# Patient Record
Sex: Male | Born: 1960 | Race: White | Hispanic: No | Marital: Married | Smoking: Former smoker
Health system: Southern US, Community
[De-identification: ages and names within clinical notes are randomized; demographics above are authoritative.]

## PROBLEM LIST (undated history)

## (undated) DIAGNOSIS — M1990 Unspecified osteoarthritis, unspecified site: Secondary | ICD-10-CM

## (undated) DIAGNOSIS — E789 Disorder of lipoprotein metabolism, unspecified: Secondary | ICD-10-CM

## (undated) HISTORY — DX: Unspecified osteoarthritis, unspecified site: M19.90

---

## 2010-04-23 ENCOUNTER — Emergency Department (HOSPITAL_COMMUNITY): Admission: EM | Admit: 2010-04-23 | Discharge: 2010-04-24 | Payer: Self-pay | Admitting: Emergency Medicine

## 2010-10-26 LAB — CBC
Hemoglobin: 14.9 g/dL (ref 13.0–17.0)
MCH: 30.7 pg (ref 26.0–34.0)
MCV: 85.6 fL (ref 78.0–100.0)
Platelets: 285 10*3/uL (ref 150–400)
RBC: 4.86 MIL/uL (ref 4.22–5.81)
WBC: 8.7 10*3/uL (ref 4.0–10.5)

## 2010-10-26 LAB — BASIC METABOLIC PANEL
CO2: 22 mEq/L (ref 19–32)
Chloride: 107 mEq/L (ref 96–112)
Creatinine, Ser: 1.14 mg/dL (ref 0.4–1.5)
GFR calc Af Amer: >60 mL/min (ref >60)
Potassium: 3.8 mEq/L (ref 3.5–5.1)
Sodium: 138 mEq/L (ref 135–145)

## 2010-10-26 LAB — PROTIME-INR
INR: 0.96 (ref 0.00–1.49)
Prothrombin Time: 13 seconds (ref 11.6–15.2)

## 2010-10-26 LAB — DIFFERENTIAL
Eosinophils Relative: 1 % (ref 0–5)
Lymphocytes Relative: 27 % (ref 12–46)
Lymphs Abs: 2.3 10*3/uL (ref 0.7–4.0)
Monocytes Relative: 7 % (ref 3–12)

## 2015-12-06 ENCOUNTER — Encounter: Payer: Self-pay | Admitting: *Deleted

## 2015-12-06 ENCOUNTER — Encounter (INDEPENDENT_AMBULATORY_CARE_PROVIDER_SITE_OTHER): Payer: Self-pay

## 2015-12-06 ENCOUNTER — Encounter: Payer: Self-pay | Admitting: Family Medicine

## 2015-12-06 ENCOUNTER — Ambulatory Visit (INDEPENDENT_AMBULATORY_CARE_PROVIDER_SITE_OTHER): Payer: PRIVATE HEALTH INSURANCE | Admitting: Family Medicine

## 2015-12-06 VITALS — BP 138/94 | HR 64 | Temp 97.5°F | Ht 67.0 in | Wt 160.8 lb

## 2015-12-06 DIAGNOSIS — Z0000 Encounter for general adult medical examination without abnormal findings: Secondary | ICD-10-CM

## 2015-12-06 DIAGNOSIS — R030 Elevated blood-pressure reading, without diagnosis of hypertension: Secondary | ICD-10-CM | POA: Insufficient documentation

## 2015-12-06 DIAGNOSIS — Z800 Family history of malignant neoplasm of digestive organs: Secondary | ICD-10-CM | POA: Insufficient documentation

## 2015-12-06 NOTE — Progress Notes (Signed)
   HPI  Patient presents today here to establish care with an annual physical.  Patient has not been to his previous provider in over 10 years. No history of any serious medical conditions except for decreased movement of his left thigh. He has no diplopia with this. It has been going on his entire life.  He is active at work, he has no formal exercise routine He watches what he eats pretty carefully, reducing fried and fatty foods and with portion control.  He denies any concerns today.  PMH: Smoking status noted His past medical, surgical, social, family history reviewed and updated in EMR ROS: Per HPI  Objective: BP 138/94 mmHg  Pulse 64  Temp(Src) 97.5 F (36.4 C) (Oral)  Ht '5\' 7"'$  (1.702 m)  Wt 160 lb 12.8 oz (72.938 kg)  BMI 25.18 kg/m2 Gen: NAD, alert, cooperative with exam HEENT: NCAT, left eye with no abduction CV: RRR, good S1/S2, no murmur Resp: CTABL, no wheezes, non-labored Abd: SNTND, BS present, no guarding or organomegaly Ext: No edema, warm Neuro: Alert and oriented, No gross deficits  Assessment and plan:  # Annual exam Normal exam except for ocular findings which are stable His brother had colon cancer at age of 46, sent him for colonoscopy Basic labs, previously elevated cholesterol and had loose stools with Lipitor, he's changed his diet pretty drastically since that time Return to clinic in 4-6 weeks to discuss high blood pressure, rechecked with manual cuff today and it was stable  # Elevated blood pressure without diagnosis of hypertension Blood pressure log    Orders Placed This Encounter  Procedures  . CMP14+EGFR  . CBC with Differential  . TSH  . PSA  . Lipid Panel  . Ambulatory referral to Gastroenterology    Referral Priority:  Routine    Referral Type:  Consultation    Referral Reason:  Specialty Services Required    Number of Visits Requested:  [place], MD [facility] 12/06/2015, 10:31  AM

## 2015-12-06 NOTE — Patient Instructions (Signed)
Great to meet you!  We will call with labs within 1 week  Keep a few blood pressures over the next 4-6 weeks  Come  Back in 4-6 weeks to discuss blood pressure

## 2015-12-07 LAB — CBC WITH DIFFERENTIAL/PLATELET
BASOS ABS: 0.1 10*3/uL (ref 0.0–0.2)
BASOS: 2 %
EOS (ABSOLUTE): 0.2 10*3/uL (ref 0.0–0.4)
Eos: 3 %
Hematocrit: 40.9 % (ref 37.5–51.0)
Hemoglobin: 13.5 g/dL (ref 12.6–17.7)
Immature Grans (Abs): 0 10*3/uL (ref 0.0–0.1)
Immature Granulocytes: 0 %
LYMPHS ABS: 2 10*3/uL (ref 0.7–3.1)
Lymphs: 29 %
MCH: 29.3 pg (ref 26.6–33.0)
MCHC: 33 g/dL (ref 31.5–35.7)
MCV: 89 fL (ref 79–97)
MONOS ABS: 0.5 10*3/uL (ref 0.1–0.9)
Monocytes: 7 %
NEUTROS ABS: 4.2 10*3/uL (ref 1.4–7.0)
Neutrophils: 59 %
PLATELETS: 270 10*3/uL (ref 150–379)
RBC: 4.61 x10E6/uL (ref 4.14–5.80)
RDW: 14 % (ref 12.3–15.4)
WBC: 7 10*3/uL (ref 3.4–10.8)

## 2015-12-07 LAB — CMP14+EGFR
A/G RATIO: 1.8 (ref 1.2–2.2)
ALT: 15 IU/L (ref 0–44)
AST: 23 IU/L (ref 0–40)
Albumin: 4.4 g/dL (ref 3.5–5.5)
Alkaline Phosphatase: 59 IU/L (ref 39–117)
BILIRUBIN TOTAL: 0.5 mg/dL (ref 0.0–1.2)
BUN/Creatinine Ratio: 13 (ref 9–20)
BUN: 13 mg/dL (ref 6–24)
CALCIUM: 9.5 mg/dL (ref 8.7–10.2)
CHLORIDE: 101 mmol/L (ref 96–106)
CO2: 24 mmol/L (ref 18–29)
Creatinine, Ser: 1.02 mg/dL (ref 0.76–1.27)
GFR, EST AFRICAN AMERICAN: 95 mL/min/{1.73_m2} (ref >59)
GFR, EST NON AFRICAN AMERICAN: 82 mL/min/{1.73_m2} (ref >59)
GLOBULIN, TOTAL: 2.5 g/dL (ref 1.5–4.5)
Glucose: 99 mg/dL (ref 65–99)
POTASSIUM: 4.9 mmol/L (ref 3.5–5.2)
SODIUM: 138 mmol/L (ref 134–144)
TOTAL PROTEIN: 6.9 g/dL (ref 6.0–8.5)

## 2015-12-07 LAB — TSH: TSH: 2.62 u[IU]/mL (ref 0.450–4.500)

## 2015-12-07 LAB — LIPID PANEL
CHOL/HDL RATIO: 3.2 ratio (ref 0.0–5.0)
Cholesterol, Total: 187 mg/dL (ref 100–199)
HDL: 58 mg/dL (ref >39)
LDL CALC: 90 mg/dL (ref 0–99)
TRIGLYCERIDES: 194 mg/dL — AB (ref 0–149)
VLDL Cholesterol Cal: 39 mg/dL (ref 5–40)

## 2015-12-07 LAB — PSA: Prostate Specific Ag, Serum: 1.5 ng/mL (ref 0.0–4.0)

## 2015-12-08 ENCOUNTER — Encounter (INDEPENDENT_AMBULATORY_CARE_PROVIDER_SITE_OTHER): Payer: Self-pay | Admitting: *Deleted

## 2016-01-04 ENCOUNTER — Ambulatory Visit (INDEPENDENT_AMBULATORY_CARE_PROVIDER_SITE_OTHER): Payer: PRIVATE HEALTH INSURANCE | Admitting: Family Medicine

## 2016-01-04 ENCOUNTER — Encounter: Payer: Self-pay | Admitting: Family Medicine

## 2016-01-04 VITALS — BP 131/84 | HR 72 | Temp 97.6°F | Ht 67.0 in | Wt 157.4 lb

## 2016-01-04 DIAGNOSIS — L989 Disorder of the skin and subcutaneous tissue, unspecified: Secondary | ICD-10-CM | POA: Diagnosis not present

## 2016-01-04 DIAGNOSIS — R030 Elevated blood-pressure reading, without diagnosis of hypertension: Secondary | ICD-10-CM | POA: Diagnosis not present

## 2016-01-04 NOTE — Patient Instructions (Signed)
Great to meet you!  Check your blood pressure a few times a month, 140/90 or less is my goal, great job making changes  Keep an eye on that skin lesion on your right wrist if it does not go away in 1-2 months or if it gets worse  Come back in 1 year unless you need us sooner.

## 2016-01-04 NOTE — Progress Notes (Signed)
   HPI  Patient presents today here for blood pressure follow-up.  Patient has been keeping a blood pressure log, his blood pressure has mostly been around 130/90 He had one reading greater than 90 diastolic and 1 reading that was 140 systolic. He has a blood pressure cuff at home. No chest pain, dyspnea, palpitations, leg edema.  He states that he's cut back on his salt intake and is eating much better. He would like to avoid medications at this time.  Skin lesion on right wrist Has been present about 4 weeks or so, not changing and not irritating.   PMH: Smoking status noted ROS: Per HPI  Objective: BP 131/84 mmHg  Pulse 72  Temp(Src) 97.6 F (36.4 C) (Oral)  Ht 5\' 7"  (1.702 m)  Wt 157 lb 6.4 oz (71.396 kg)  BMI 24.65 kg/m2 Gen: NAD, alert, cooperative with exam HEENT: NCAT CV: RRR, good S1/S2, no murmur Resp: CTABL, no wheezes, non-labored Ext: No edema, warm Neuro: Alert and oriented, No gross deficits  Skin Right dorsal wrist with 6 mm x 8 mm scaly lesion on an erythematous base, slightly verrucous in appearance  Left forearm with small white lesion consistent with actinic keratosis  Assessment and plan:  # Elevated blood pressure without diagnosis of hypertension Blood pressure is borderline to good Recommended continued aggressive diet and lifestyle changes Continue to monitor closely Follow-up in one year unless blood pressure rises above 140/90 consistently  # Skin lesion Right wrist is suspicious for wart versus squamous cell carcinoma, watchful waiting for 1-2 months, if not resolved or if worsening will come back for shave biopsy Left forearm lesion looks more like an actinic keratosis, however he does not have a history of any AK's, he will monitor this as well we discussed the risk of transformation into skin cancer   Murtis SinkSam Bradshaw, MD [facility] 01/04/2016, 4:45 PM

## 2016-07-10 ENCOUNTER — Other Ambulatory Visit: Payer: Self-pay | Admitting: Occupational Medicine

## 2016-07-10 ENCOUNTER — Ambulatory Visit: Payer: Self-pay

## 2016-07-10 DIAGNOSIS — M79642 Pain in left hand: Secondary | ICD-10-CM

## 2017-04-09 ENCOUNTER — Ambulatory Visit (INDEPENDENT_AMBULATORY_CARE_PROVIDER_SITE_OTHER): Payer: PRIVATE HEALTH INSURANCE | Admitting: Family Medicine

## 2017-04-09 ENCOUNTER — Encounter: Payer: Self-pay | Admitting: Family Medicine

## 2017-04-09 VITALS — BP 122/80 | HR 74 | Temp 98.7°F | Ht 67.0 in | Wt 159.2 lb

## 2017-04-09 DIAGNOSIS — L247 Irritant contact dermatitis due to plants, except food: Secondary | ICD-10-CM | POA: Diagnosis not present

## 2017-04-09 MED ORDER — TRIAMCINOLONE ACETONIDE 0.5 % EX OINT: 1.0000 "application " | TOPICAL_OINTMENT | Freq: Two times a day (BID) | CUTANEOUS | 0 refills | Status: DC

## 2017-04-09 MED ORDER — TRIAMCINOLONE ACETONIDE 40 MG/ML IJ SUSP
40.0000 mg | Freq: Once | INTRAMUSCULAR | Status: AC
  Administered 2017-04-09: 40 mg via INTRAMUSCULAR

## 2017-04-09 NOTE — Progress Notes (Signed)
   HPI  Patient presents today really to rash.  Patient explains he had symptoms for 4 days after being exposed to poison oak after weed eating. He states he has itchy bumps on his bilateral arms, legs, and back.  He denies fever, chills, sweats, or difficulty tolerating foods or fluids.  PMH: Smoking status noted ROS: Per HPI  Objective: BP 122/80   Pulse 74   Temp 98.7 F (37.1 C) (Oral)   Ht 5\' 7"  (1.702 m)   Wt 159 lb 3.2 oz (72.2 kg)   BMI 24.93 kg/m  Gen: NAD, alert, cooperative with exam HEENT: NCAT CV: RRR, good S1/S2, no murmur Resp: CTABL, no wheezes, non-labored Ext: No edema, warm Neuro: Alert and oriented, No gross deficits Skin:  Erythematous papules scattered across bilateral arms, low back, right lower extremity  Assessment and plan:  # Contact dermatitis, poison oak exposure Rash consistent with contact dermatitis Treat with IM Kenalog which should last in his system 3 weeks. Also Kenalog ointment given Discussed supportive care Return to clinic with any concerns  Meds ordered this encounter  Medications  . triamcinolone acetonide (KENALOG-40) injection 40 mg  . triamcinolone ointment (KENALOG) 0.5 %    Sig: Apply 1 application topically 2 (two) times daily.    Dispense:  30 g    Refill:  0    Murtis Sink, MD [place] 04/09/2017, 3:01 PM

## 2017-04-09 NOTE — Patient Instructions (Addendum)
Great to see you!  Come back with any concerns  Use kenalog ointment as needed up to twice daily   Contact Dermatitis Dermatitis is redness, soreness, and swelling (inflammation) of the skin. Contact dermatitis is a reaction to certain substances that touch the skin. You either touched something that irritated your skin, or you have allergies to something you touched. Follow these instructions at home: Skin Care  Moisturize your skin as needed.  Apply cool compresses to the affected areas.  Try taking a bath with: ? Epsom salts. Follow the instructions on the package. You can get these at a pharmacy or grocery store. ? Baking soda. Pour a small amount into the bath as told by your doctor. ? Colloidal oatmeal. Follow the instructions on the package. You can get this at a pharmacy or grocery store.  Try applying baking soda paste to your skin. Stir water into baking soda until it looks like paste.  Do not scratch your skin.  Bathe less often.  Bathe in lukewarm water. Avoid using hot water. Medicines  Take or apply over-the-counter and prescription medicines only as told by your doctor.  If you were prescribed an antibiotic medicine, take or apply your antibiotic as told by your doctor. Do not stop taking the antibiotic even if your condition starts to get better. General instructions  Keep all follow-up visits as told by your doctor. This is important.  Avoid the substance that caused your reaction. If you do not know what caused it, keep a journal to try to track what caused it. Write down: ? What you eat. ? What cosmetic products you use. ? What you drink. ? What you wear in the affected area. This includes jewelry.  If you were given a bandage (dressing), take care of it as told by your doctor. This includes when to change and remove it. Contact a doctor if:  You do not get better with treatment.  Your condition gets worse.  You have signs of infection such  as: ? Swelling. ? Tenderness. ? Redness. ? Soreness. ? Warmth.  You have a fever.  You have new symptoms. Get help right away if:  You have a very bad headache.  You have neck pain.  Your neck is stiff.  You throw up (vomit).  You feel very sleepy.  You see red streaks coming from the affected area.  Your bone or joint underneath the affected area becomes painful after the skin has healed.  The affected area turns darker.  You have trouble breathing. This information is not intended to replace advice given to you by your health care provider. Make sure you discuss any questions you have with your health care provider. Document Released: 05/27/2009 Document Revised: 01/05/2016 Document Reviewed: 12/15/2014 Elsevier Interactive Patient Education  2018 [employer].

## 2017-04-18 ENCOUNTER — Encounter (INDEPENDENT_AMBULATORY_CARE_PROVIDER_SITE_OTHER): Payer: Self-pay | Admitting: *Deleted

## 2017-04-18 ENCOUNTER — Encounter: Payer: Self-pay | Admitting: Family Medicine

## 2017-04-18 ENCOUNTER — Ambulatory Visit (INDEPENDENT_AMBULATORY_CARE_PROVIDER_SITE_OTHER): Payer: PRIVATE HEALTH INSURANCE | Admitting: Family Medicine

## 2017-04-18 VITALS — BP 136/82 | HR 59 | Temp 97.8°F | Ht 67.0 in | Wt 154.8 lb

## 2017-04-18 DIAGNOSIS — Z0000 Encounter for general adult medical examination without abnormal findings: Secondary | ICD-10-CM | POA: Diagnosis not present

## 2017-04-18 DIAGNOSIS — Z800 Family history of malignant neoplasm of digestive organs: Secondary | ICD-10-CM
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
## 2017-04-18 NOTE — Patient Instructions (Signed)
Great to see you! We will work on a referral to GI for a colonoscopy   Health Maintenance, Male A healthy lifestyle and preventive care is important for your health and wellness. Ask your health care provider about what schedule of regular examinations is right for you. What should I know about weight and diet? Eat a Healthy Diet  Eat plenty of vegetables, fruits, whole grains, low-fat dairy products, and lean protein.  Do not eat a lot of foods high in solid fats, added sugars, or salt.  Maintain a Healthy Weight Regular exercise can help you achieve or maintain a healthy weight. You should:  Do at least 150 minutes of exercise each week. The exercise should increase your heart rate and make you sweat (moderate-intensity exercise).  Do strength-training exercises at least twice a week.  Watch Your Levels of Cholesterol and Blood Lipids  Have your blood tested for lipids and cholesterol every 5 years starting at 56 years of age. If you are at high risk for heart disease, you should start having your blood tested when you are 56 years old. You may need to have your cholesterol levels checked more often if: ? Your lipid or cholesterol levels are high. ? You are older than 56 years of age. ? You are at high risk for heart disease.  What should I know about cancer screening? Many types of cancers can be detected early and may often be prevented. Lung Cancer  You should be screened every year for lung cancer if: ? You are a current smoker who has smoked for at least 30 years. ? You are a former smoker who has quit within the past 15 years.  Talk to your health care provider about your screening options, when you should start screening, and how often you should be screened.  Colorectal Cancer  Routine colorectal cancer screening usually begins at 56 years of age and should be repeated every 5-10 years until you are 56 years old. You may need to be screened more often if early forms  of precancerous polyps or small growths are found. Your health care provider may recommend screening at an earlier age if you have risk factors for colon cancer.  Your health care provider may recommend using home test kits to check for hidden blood in the stool.  A small camera at the end of a tube can be used to examine your colon (sigmoidoscopy or colonoscopy). This checks for the earliest forms of colorectal cancer.  Prostate and Testicular Cancer  Depending on your age and overall health, your health care provider may do certain tests to screen for prostate and testicular cancer.  Talk to your health care provider about any symptoms or concerns you have about testicular or prostate cancer.  Skin Cancer  Check your skin from head to toe regularly.  Tell your health care provider about any new moles or changes in moles, especially if: ? There is a change in a mole's size, shape, or color. ? You have a mole that is larger than a pencil eraser.  Always use sunscreen. Apply sunscreen liberally and repeat throughout the day.  Protect yourself by wearing long sleeves, pants, a wide-brimmed hat, and sunglasses when outside.  What should I know about heart disease, diabetes, and high blood pressure?  If you are 2218-56 years of age, have your blood pressure checked every 3-5 years. If you are 56 years of age or older, have your blood pressure checked every year. You should  have your blood pressure measured twice-once when you are at a hospital or clinic, and once when you are not at a hospital or clinic. Record the average of the two measurements. To check your blood pressure when you are not at a hospital or clinic, you can use: ? An automated blood pressure machine at a pharmacy. ? A home blood pressure monitor.  Talk to your health care provider about your target blood pressure.  If you are between 35-35 years old, ask your health care provider if you should take aspirin to prevent heart  disease.  Have regular diabetes screenings by checking your fasting blood sugar level. ? If you are at a normal weight and have a low risk for diabetes, have this test once every three years after the age of 72. ? If you are overweight and have a high risk for diabetes, consider being tested at a younger age or more often.  A one-time screening for abdominal aortic aneurysm (AAA) by ultrasound is recommended for men aged 20-75 years who are current or former smokers. What should I know about preventing infection? Hepatitis B If you have a higher risk for hepatitis B, you should be screened for this virus. Talk with your health care provider to find out if you are at risk for hepatitis B infection. Hepatitis C Blood testing is recommended for:  Everyone born from [birth year] through 1965.  Anyone with known risk factors for hepatitis C.  Sexually Transmitted Diseases (STDs)  You should be screened each year for STDs including gonorrhea and chlamydia if: ? You are sexually active and are younger than 56 years of age. ? You are older than 56 years of age and your health care provider tells you that you are at risk for this type of infection. ? Your sexual activity has changed since you were last screened and you are at an increased risk for chlamydia or gonorrhea. Ask your health care provider if you are at risk.  Talk with your health care provider about whether you are at high risk of being infected with HIV. Your health care provider may recommend a prescription medicine to help prevent HIV infection.  What else can I do?  Schedule regular health, dental, and eye exams.  Stay current with your vaccines (immunizations).  Do not use any tobacco products, such as cigarettes, chewing tobacco, and e-cigarettes. If you need help quitting, ask your health care provider.  Limit alcohol intake to no more than 2 drinks per day. One drink equals 12 ounces of beer, 5 ounces of wine, or 1 ounces of  hard liquor.  Do not use street drugs.  Do not share needles.  Ask your health care provider for help if you need support or information about quitting drugs.  Tell your health care provider if you often feel depressed.  Tell your health care provider if you have ever been abused or do not feel safe at home. This information is not intended to replace advice given to you by your health care provider. Make sure you discuss any questions you have with your health care provider. Document Released: 01/26/2008 Document Revised: 03/28/2016 Document Reviewed: 05/03/2015 Elsevier Interactive Patient Education  [employer].

## 2017-04-19 ENCOUNTER — Other Ambulatory Visit: Payer: Self-pay

## 2017-04-19 DIAGNOSIS — E876 Hypokalemia: Secondary | ICD-10-CM

## 2017-04-19 LAB — CBC WITH DIFFERENTIAL/PLATELET
BASOS: 0 %
Basophils Absolute: 0 10*3/uL (ref 0.0–0.2)
EOS (ABSOLUTE): 0 10*3/uL (ref 0.0–0.4)
EOS: 1 %
HEMATOCRIT: 42 % (ref 37.5–51.0)
HEMOGLOBIN: 13.7 g/dL (ref 13.0–17.7)
IMMATURE GRANULOCYTES: 0 %
Immature Grans (Abs): 0 10*3/uL (ref 0.0–0.1)
LYMPHS ABS: 1.8 10*3/uL (ref 0.7–3.1)
Lymphs: 26 %
MCH: 29.7 pg (ref 26.6–33.0)
MCHC: 32.6 g/dL (ref 31.5–35.7)
MCV: 91 fL (ref 79–97)
MONOCYTES: 10 %
MONOS ABS: 0.7 10*3/uL (ref 0.1–0.9)
Neutrophils Absolute: 4.4 10*3/uL (ref 1.4–7.0)
Neutrophils: 63 %
Platelets: 307 10*3/uL (ref 150–379)
RBC: 4.62 x10E6/uL (ref 4.14–5.80)
RDW: 13.7 % (ref 12.3–15.4)
WBC: 7 10*3/uL (ref 3.4–10.8)

## 2017-04-19 LAB — PSA: Prostate Specific Ag, Serum: 1.1 ng/mL (ref 0.0–4.0)

## 2017-04-19 LAB — CMP14+EGFR
ALBUMIN: 5 g/dL (ref 3.5–5.5)
ALT: 21 IU/L (ref 0–44)
AST: 20 IU/L (ref 0–40)
Albumin/Globulin Ratio: 2.1 (ref 1.2–2.2)
Alkaline Phosphatase: 53 IU/L (ref 39–117)
BUN / CREAT RATIO: 16 (ref 9–20)
BUN: 20 mg/dL (ref 6–24)
Bilirubin Total: 0.8 mg/dL (ref 0.0–1.2)
CALCIUM: 10.3 mg/dL — AB (ref 8.7–10.2)
CO2: 23 mmol/L (ref 20–29)
CREATININE: 1.26 mg/dL (ref 0.76–1.27)
Chloride: 101 mmol/L (ref 96–106)
GFR, EST AFRICAN AMERICAN: 73 mL/min/{1.73_m2} (ref >59)
GFR, EST NON AFRICAN AMERICAN: 63 mL/min/{1.73_m2} (ref >59)
GLOBULIN, TOTAL: 2.4 g/dL (ref 1.5–4.5)
GLUCOSE: 85 mg/dL (ref 65–99)
Potassium: 5.3 mmol/L — ABNORMAL HIGH (ref 3.5–5.2)
SODIUM: 137 mmol/L (ref 134–144)
TOTAL PROTEIN: 7.4 g/dL (ref 6.0–8.5)

## 2017-04-19 LAB — LIPID PANEL
CHOL/HDL RATIO: 3.9 ratio (ref 0.0–5.0)
Cholesterol, Total: 197 mg/dL (ref 100–199)
HDL: 51 mg/dL (ref >39)
LDL CALC: 130 mg/dL — AB (ref 0–99)
TRIGLYCERIDES: 80 mg/dL (ref 0–149)
VLDL Cholesterol Cal: 16 mg/dL (ref 5–40)

## 2017-04-19 LAB — TSH: TSH: 1.58 u[IU]/mL (ref 0.450–4.500)

## 2017-04-22 ENCOUNTER — Other Ambulatory Visit: Payer: PRIVATE HEALTH INSURANCE

## 2017-04-22 DIAGNOSIS — E876 Hypokalemia: Secondary | ICD-10-CM

## 2017-04-22 LAB — BMP8+EGFR
BUN / CREAT RATIO: 13 (ref 9–20)
BUN: 15 mg/dL (ref 6–24)
CO2: 25 mmol/L (ref 20–29)
CREATININE: 1.13 mg/dL (ref 0.76–1.27)
Calcium: 9.7 mg/dL (ref 8.7–10.2)
Chloride: 100 mmol/L (ref 96–106)
GFR calc non Af Amer: 72 mL/min/{1.73_m2} (ref >59)
GFR, EST AFRICAN AMERICAN: 84 mL/min/{1.73_m2} (ref >59)
Glucose: 84 mg/dL (ref 65–99)
Potassium: 4.8 mmol/L (ref 3.5–5.2)
Sodium: 138 mmol/L (ref 134–144)

## 2017-05-01 ENCOUNTER — Encounter: Payer: Self-pay | Admitting: Family Medicine

## 2017-05-01 ENCOUNTER — Ambulatory Visit (INDEPENDENT_AMBULATORY_CARE_PROVIDER_SITE_OTHER): Payer: PRIVATE HEALTH INSURANCE | Admitting: Family Medicine

## 2017-05-01 VITALS — BP 124/83 | HR 66 | Temp 97.8°F | Ht 67.0 in | Wt 157.0 lb

## 2017-05-01 DIAGNOSIS — L309 Dermatitis, unspecified: Secondary | ICD-10-CM

## 2017-05-01 MED ORDER — BETAMETHASONE SOD PHOS & ACET 6 (3-3) MG/ML IJ SUSP
6.0000 mg | Freq: Once | INTRAMUSCULAR | Status: AC
  Administered 2017-05-01: 6 mg via INTRAMUSCULAR

## 2017-05-01 MED ORDER — TRIAMCINOLONE ACETONIDE 0.5 % EX CREA: 1.0000 "application " | TOPICAL_CREAM | Freq: Three times a day (TID) | CUTANEOUS | 0 refills | Status: DC

## 2017-05-01 NOTE — Progress Notes (Signed)
Chief Complaint  Patient presents with  . Rash    pt here today c/o itchy rash for the past few days on his arms, neck, shoulders and upper back.    HPI  Patient presents today for Onset 3-4 days ago of rash similar to last month when he had some poison oak poison ivy. That was treated with a steroid shot per Dr. Ermalinda Memos. He also put some cream on it. It cleared up nicely. This seems to be about the same thing. He doesn't know where it came from. He thinks he may not changed the sheets on the bed afterwards or he might of gotten off of wearing the same clothing. However he is aware that washing in warm soapy water should remove the resin from clothing etc.Today he has pruritic eruption on both upper arms on the ventral surface. This extends over the right shoulder. Also to the posterior neck. Some noted also on the right chest.  PMH: Smoking status noted ROS: Per HPI  Objective: BP 124/83   Pulse 66   Temp 97.8 F (36.6 C) (Oral)   Ht  (1.702 m)   Wt 157 lb (71.2 kg)   BMI 24.59 kg/m  Gen: NAD, alert, cooperative with exam HEENT: NCAT, EOMI, PERRL CV: RRR, good S1/S2, no murmur Resp: CTABL, no wheezes, non-labored Skin: Maculopapular erythema with few scattered vesicles noted on the ball of the right shoulder extending across the posterior trapezius to the posterior neck. Skin vesicles with mild erythema noted on the ventral surface of both forearms and upper arms Ext: No edema, warm Neuro: Alert and oriented, No gross deficits  Assessment and plan:  1. Dermatitis     Meds ordered this encounter  Medications  . betamethasone acetate-betamethasone sodium phosphate (CELESTONE) injection 6 mg  . triamcinolone cream (KENALOG) 0.5 %    Sig: Apply 1 application topically 3 (three) times daily.    Dispense:  30 g    Refill:  0    No orders of the defined types were placed in this encounter.   Follow up as needed.  Mechele Claude, MD

## 2017-05-13 ENCOUNTER — Encounter (HOSPITAL_COMMUNITY): Payer: Self-pay | Admitting: Emergency Medicine

## 2017-05-13 ENCOUNTER — Emergency Department (HOSPITAL_COMMUNITY)
Admission: EM | Admit: 2017-05-13 | Discharge: 2017-05-13 | Disposition: A | Discharge status: Home / Self Care | Payer: PRIVATE HEALTH INSURANCE | Attending: Emergency Medicine | Admitting: Emergency Medicine

## 2017-05-13 DIAGNOSIS — R21 Rash and other nonspecific skin eruption: Secondary | ICD-10-CM | POA: Insufficient documentation

## 2017-05-13 DIAGNOSIS — Z87891 Personal history of nicotine dependence: Secondary | ICD-10-CM | POA: Insufficient documentation

## 2017-05-13 MED ORDER — HYDROXYZINE PAMOATE 25 MG PO CAPS: 25.0000 mg | ORAL_CAPSULE | Freq: Four times a day (QID) | ORAL | 0 refills | Status: DC | PRN

## 2017-05-13 NOTE — ED Triage Notes (Signed)
Pt states he was recently treated with prednisone for poison ivy. He states the rash got better after treatment last week, but got worse again today.

## 2017-05-13 NOTE — ED Provider Notes (Signed)
[facility] DEPT Provider Note   CSN: [id number] Arrival date & time: 05/13/17  2017     History   Chief Complaint Chief Complaint  Patient presents with  . Rash    HPI Rayen Dafoe is a 56 y.o. male.  Patient is a 56 year old male who presents to the emergency department with a complaint of a rash.  The patient states that he woke up today to find a rash around his neck. He says he thinks he panicked because he felt flush and started noticing that the rash seemed to be moving over several areas of his body. He states that after he calmed down the itching was better the flushing was better but he still notices rash present. It is of note that the patient has had 2 rounds of steroids for what was initially believed to be poison ivy. He states that when he is taking the steroids to itching and burning seemed to improve, the rash improves, and when he is off of the steroids that the rash returns. He presents at this time for evaluation of the rash that he found when he woke up today.  The patient states that he has not changed his food or drink. He is not change medications, he is not been out of the country. He denies new clothing, new soaps, new dryer sheets, and new detergents. He does not recall being bit by anything. No new animals in the house. He states that his knee lives with his wife, and his wife does not have the same rash.   The history is provided by the patient.  Rash      Past Medical History:  Diagnosis Date  . Arthritis     Patient Active Problem List   Diagnosis Date Noted  . Skin lesion 01/04/2016  . Family history of colon cancer 12/06/2015  . Elevated blood pressure reading without diagnosis of hypertension 12/06/2015    History reviewed. No pertinent surgical history.     Home Medications    Prior to Admission medications   Medication Sig Start Date End Date Taking? Authorizing Provider  triamcinolone cream (KENALOG) 0.5 % Apply 1  application topically 3 (three) times daily. 05/01/17   Mechele Claude, MD    Family History Family History  Problem Relation Age of Onset  . Hypertension Mother   . Stroke Mother   . Cancer Father        esophgeal cancer  . Cancer Daughter        colon cancer    Social History Social History  Substance Use Topics  . Smoking status: Former Games developer  . Smokeless tobacco: Never Used  . Alcohol use 3.6 oz/week    6 Cans of beer per week     Allergies   Patient has no known allergies.   Review of Systems Review of Systems  Constitutional: Negative for activity change.       All ROS Neg except as noted in HPI  HENT: Negative for nosebleeds.   Eyes: Negative for photophobia and discharge.  Respiratory: Negative for cough, shortness of breath and wheezing.   Cardiovascular: Negative for chest pain and palpitations.  Gastrointestinal: Negative for abdominal pain and blood in stool.  Genitourinary: Negative for dysuria, frequency and hematuria.  Musculoskeletal: Negative for arthralgias, back pain and neck pain.  Skin: Positive for rash.  Neurological: Negative for dizziness, seizures and speech difficulty.  Psychiatric/Behavioral: Negative for confusion and hallucinations.     Physical Exam Updated Vital Signs BP Marland Kitchen)  158/96   Pulse 78   Temp 98.1 F (36.7 C)   Resp 18   Ht  (1.702 m)   Wt 72.6 kg (160 lb)   SpO2 99%   BMI 25.06 kg/m   Physical Exam  Constitutional: He is oriented to person, place, and time. He appears well-developed and well-nourished.  Non-toxic appearance.  HENT:  Head: Normocephalic.  Right Ear: Tympanic membrane and external ear normal.  Left Ear: Tympanic membrane and external ear normal.  Eyes: Pupils are equal, round, and reactive to light. EOM and lids are normal.  Neck: Normal range of motion. Neck supple. Carotid bruit is not present.  Cardiovascular: Normal rate, regular rhythm, normal heart sounds, intact distal pulses and  normal pulses.   Pulmonary/Chest: Breath sounds normal. No respiratory distress.  Abdominal: Soft. Bowel sounds are normal. There is no tenderness. There is no guarding.  Musculoskeletal: Normal range of motion.  Lymphadenopathy:       Head (right side): No submandibular adenopathy present.       Head (left side): No submandibular adenopathy present.    He has no cervical adenopathy.  Neurological: He is alert and oriented to person, place, and time. He has normal strength. No cranial nerve deficit or sensory deficit.  Skin: Skin is warm and dry.  There is a red fine papular rash noted on the abdomen, back, neck, and a few on the upper extremities. No red streaks appreciated. No hot areas appreciated. No drainage or weeping appreciated.  Psychiatric: He has a normal mood and affect. His speech is normal.  Nursing note and vitals reviewed.    ED Treatments / Results  Labs (all labs ordered are listed, but only abnormal results are displayed) Labs Reviewed - No data to display  EKG  EKG Interpretation None       Radiology No results found.  Procedures Procedures (including critical care time)  Medications Ordered in ED Medications - No data to display   Initial Impression / Assessment and Plan / ED Course  I have reviewed the triage vital signs and the nursing notes.  Pertinent labs & imaging results that were available during my care of the patient were reviewed by me and considered in my medical decision making (see chart for details).      Final Clinical Impressions(s) / ED Diagnoses MDM Blood pressure is elevated, otherwise vital signs are within normal limits. Pulse oximetry is 99% on room air. Within normal limits by my interpretation. The patient has a red papular rash on the abdomen, back, neck, and upper extremities.  Patient denies any recent changes. In particular there's been no change in medicines or foods. There's been no change in clothing or dryer  sheets or detergents. The patient has not been out of the country recently.  I've opted not to treat the patient with steroids at this time. I think he would be best served by a dermatology consultation. I did offer the patient medication for comfort including Vistaril. The patient states that he would like to go ahead and go to work tonight so he will receive a prescription for the Vistaril to use. He will be asked to use Allegra during the day and Vistaril at night. Patient is referred to dermatology. The patient is asked to return to the emergency department immediately if any emergent changes, problems, or concerns. Patient is in agreement with this plan.    Final diagnoses:  Rash    New Prescriptions New Prescriptions  HYDROXYZINE (VISTARIL) 25 MG CAPSULE    Take 1 capsule (25 mg total) by mouth every 6 (six) hours as needed for itching.     Ivery Quale, PA-C 05/13/17 1610    Gwyneth Sprout, MD 05/15/17 2100

## 2017-05-13 NOTE — Discharge Instructions (Signed)
Your blood pressure is elevated, otherwise your vital signs within normal limits. Your oxygen level is 99% on room air. Within normal limits. Please use Allegra each morning for itching and burning, please use Vistaril in the evenings and at bedtime. You may also use Vistaril every 6 hours if needed for severe itching or burning.This medication may cause drowsiness. Please do not drink, drive, or participate in activity that requires concentration while taking this medication. Please see list register or member of her team for dermatology evaluation.

## 2017-05-15 ENCOUNTER — Ambulatory Visit: Payer: PRIVATE HEALTH INSURANCE | Admitting: Family Medicine

## 2017-07-10 ENCOUNTER — Other Ambulatory Visit (INDEPENDENT_AMBULATORY_CARE_PROVIDER_SITE_OTHER): Payer: Self-pay | Admitting: *Deleted

## 2017-07-10 DIAGNOSIS — Z800 Family history of malignant neoplasm of digestive organs: Secondary | ICD-10-CM | POA: Insufficient documentation

## 2017-07-10 DIAGNOSIS — Z1211 Encounter for screening for malignant neoplasm of colon: Secondary | ICD-10-CM | POA: Insufficient documentation

## 2017-09-20 ENCOUNTER — Telehealth (INDEPENDENT_AMBULATORY_CARE_PROVIDER_SITE_OTHER): Payer: Self-pay | Admitting: *Deleted

## 2017-09-20 ENCOUNTER — Encounter (INDEPENDENT_AMBULATORY_CARE_PROVIDER_SITE_OTHER): Payer: Self-pay | Admitting: *Deleted

## 2017-09-20 NOTE — Telephone Encounter (Signed)
Patient needs trilyte 

## 2017-09-23 MED ORDER — PEG 3350-KCL-NA BICARB-NACL 420 G PO SOLR: 4000.0000 mL | Freq: Once | ORAL | 0 refills | Status: AC

## 2017-10-03 ENCOUNTER — Telehealth (INDEPENDENT_AMBULATORY_CARE_PROVIDER_SITE_OTHER): Payer: Self-pay | Admitting: *Deleted

## 2017-10-03 NOTE — Telephone Encounter (Signed)
Referring MD/PCP: bradshaw   Procedure: tcs  Reason/Indication:  Screening, fam hx colon ca  Has patient had this procedure before?  Yes, brother  If so, when, by whom and where?    Is there a family history of colon cancer?  no  Who?  What age when diagnosed?    Is patient diabetic?   no      Does patient have prosthetic heart valve or mechanical valve?  no  Do you have a pacemaker?  no  Has patient ever had endocarditis? no  Has patient had joint replacement within last 12 months?  no  Is patient constipated or do they take laxatives? some  Does patient have a history of alcohol/drug use?  no  Is patient on blood thinner such as Coumadin, Plavix and/or Aspirin? no  Medications: none  Allergies: nkda  Medication Adjustment per Dr Keane Policeehman/Terri Setzer, NP:   Procedure date & time: 10/30/17 at 930

## 2017-10-07 NOTE — Telephone Encounter (Signed)
agree

## 2017-10-30 ENCOUNTER — Ambulatory Visit (HOSPITAL_COMMUNITY)
Admission: RE | Admit: 2017-10-30 | Discharge: 2017-10-30 | Disposition: A | Discharge status: Home / Self Care | Payer: PRIVATE HEALTH INSURANCE | Source: Ambulatory Visit | Attending: Internal Medicine | Admitting: Internal Medicine

## 2017-10-30 ENCOUNTER — Encounter (HOSPITAL_COMMUNITY)
Admission: RE | Disposition: A | Discharge status: Home / Self Care | Payer: Self-pay | Source: Ambulatory Visit | Attending: Internal Medicine

## 2017-10-30 ENCOUNTER — Encounter (HOSPITAL_COMMUNITY): Payer: Self-pay

## 2017-10-30 ENCOUNTER — Other Ambulatory Visit: Payer: Self-pay

## 2017-10-30 DIAGNOSIS — Z79899 Other long term (current) drug therapy: Secondary | ICD-10-CM | POA: Insufficient documentation

## 2017-10-30 DIAGNOSIS — Z800 Family history of malignant neoplasm of digestive organs: Secondary | ICD-10-CM | POA: Insufficient documentation

## 2017-10-30 DIAGNOSIS — K648 Other hemorrhoids: Secondary | ICD-10-CM | POA: Diagnosis not present

## 2017-10-30 DIAGNOSIS — M1990 Unspecified osteoarthritis, unspecified site: Secondary | ICD-10-CM | POA: Diagnosis not present

## 2017-10-30 DIAGNOSIS — Z1211 Encounter for screening for malignant neoplasm of colon: Secondary | ICD-10-CM | POA: Insufficient documentation

## 2017-10-30 DIAGNOSIS — Z87891 Personal history of nicotine dependence: Secondary | ICD-10-CM | POA: Insufficient documentation

## 2017-10-30 HISTORY — PX: COLONOSCOPY: SHX5424

## 2017-10-30 SURGERY — COLONOSCOPY
Anesthesia: Moderate Sedation

## 2017-10-30 MED ORDER — MIDAZOLAM HCL 5 MG/5ML IJ SOLN
INTRAMUSCULAR | Status: DC | PRN
  Administered 2017-10-30 (×3): 2 mg via INTRAVENOUS

## 2017-10-30 MED ORDER — SODIUM CHLORIDE 0.9 % IV SOLN
INTRAVENOUS | Status: DC
  Administered 2017-10-30: 09:00:00 via INTRAVENOUS

## 2017-10-30 MED ORDER — MEPERIDINE HCL 50 MG/ML IJ SOLN
INTRAMUSCULAR | Status: AC
  Filled 2017-10-30: qty 1

## 2017-10-30 MED ORDER — MIDAZOLAM HCL 5 MG/5ML IJ SOLN
INTRAMUSCULAR | Status: AC
  Filled 2017-10-30: qty 10

## 2017-10-30 MED ORDER — MEPERIDINE HCL 50 MG/ML IJ SOLN
INTRAMUSCULAR | Status: DC | PRN
  Administered 2017-10-30 (×2): 25 mg via INTRAVENOUS

## 2017-10-30 MED ORDER — STERILE WATER FOR IRRIGATION IR SOLN
Status: DC | PRN
  Administered 2017-10-30: 100 mL

## 2017-10-30 NOTE — H&P (Signed)
Dylan Miller is an 57 y.o. male.   Chief Complaint: Patient is here for colonoscopy. HPI: Patient is 57 year old Caucasian male who is here for screening colonoscopy. He denies abdominal pain change in bowel habits or rectal bleeding. Family history is significant for CRC and brother who is 538 at the time of diagnosis and died a year later of metastatic this disease  Past Medical History:  Diagnosis Date  . Arthritis     History reviewed. No pertinent surgical history.  Family History  Problem Relation Age of Onset  . Hypertension Mother   . Stroke Mother   . Cancer Father        esophgeal cancer  . Cancer Daughter        colon cancer   Social History:  reports that he has quit smoking. he has never used smokeless tobacco. He reports that he drinks about 3.6 oz of alcohol per week. He reports that he does not use drugs.  Allergies: No Known Allergies  Medications Prior to Admission  Medication Sig Dispense Refill  . ibuprofen (ADVIL,MOTRIN) 200 MG tablet Take 200 mg by mouth daily as needed for moderate pain.    . naproxen sodium (ALEVE) 220 MG tablet Take 220 mg by mouth daily as needed (pain).    . hydrOXYzine (VISTARIL) 25 MG capsule Take 1 capsule (25 mg total) by mouth every 6 (six) hours as needed for itching. 20 capsule 0  . triamcinolone cream (KENALOG) 0.5 % Apply 1 application topically 3 (three) times daily. (Patient not taking: Reported on 10/21/2017) 30 g 0    No results found for this or any previous visit (from the past 48 hour(s)). No results found.  ROS  Blood pressure 129/80, pulse 69, temperature 98.2 F (36.8 C), temperature source Oral, resp. rate 15, height 5\' 7"  (1.702 m), weight 160 lb (72.6 kg), SpO2 98 %. Physical Exam  Constitutional: He appears well-developed and well-nourished.  HENT:  Mouth/Throat: Oropharynx is clear and moist.  Eyes: Conjunctivae are normal. No scleral icterus.  Neck: No thyromegaly present.  Cardiovascular: Normal  rate, regular rhythm and normal heart sounds.  No murmur heard. Respiratory: Effort normal and breath sounds normal.  GI: Soft. He exhibits no distension and no mass. There is no tenderness.  Musculoskeletal: He exhibits no edema.  Lymphadenopathy:    He has no cervical adenopathy.  Neurological: He is alert.  Skin: Skin is warm and dry.     Assessment/Plan High risk screening colonoscopy.  Lionel DecemberNajeeb Rehman, MD 10/30/2017, 9:21 AM

## 2017-10-30 NOTE — Op Note (Signed)
[facility] Patient Name: Dylan Miller Procedure Date: 10/30/2017 9:08 AM MRN: [id number] Date of Birth: 29-Mar-1961 Attending MD: Lionel December , MD CSN: [id number] Age: 57 Admit Type: Outpatient Procedure:                Colonoscopy Indications:              Screening in patient at increased risk: Colorectal                            cancer in brother before age 70 Providers:                Lionel December, MD, Edrick Kins, RN, Dyann Ruddle Referring MD:             Pamala Duffel. Ermalinda Memos, MD Medicines:                Meperidine 50 mg IV, Midazolam 6 mg IV Complications:            No immediate complications. Estimated Blood Loss:     Estimated blood loss: none. Procedure:                Pre-Anesthesia Assessment:                           - Prior to the procedure, a History and Physical                            was performed, and patient medications and                            allergies were reviewed. The patient's tolerance of                            previous anesthesia was also reviewed. The risks                            and benefits of the procedure and the sedation                            options and risks were discussed with the patient.                            All questions were answered, and informed consent                            was obtained. Prior Anticoagulants: The patient has                            taken no previous anticoagulant or antiplatelet                            agents. ASA Grade Assessment: I - A normal, healthy                            patient. After reviewing the risks and benefits,  the patient was deemed in satisfactory condition to                            undergo the procedure.                           After obtaining informed consent, the colonoscope                            was passed under direct vision. Throughout the                            procedure, the patient's blood pressure,  pulse, and                            oxygen saturations were monitored continuously. The                            [id number] ([id number]) was introduced through the                            anus and advanced to the the cecum, identified by                            appendiceal orifice and ileocecal valve. The                            colonoscopy was performed without difficulty. The                            patient tolerated the procedure well. The quality                            of the bowel preparation was excellent. The                            ileocecal valve, appendiceal orifice, and rectum                            were photographed. Scope In: 9:30:38 AM Scope Out: 9:48:00 AM Scope Withdrawal Time: 0 hours 11 minutes 46 seconds  Total Procedure Duration: 0 hours 17 minutes 22 seconds  Findings:      The perianal and digital rectal examinations were normal.      The colon (entire examined portion) appeared normal.      Internal hemorrhoids were found during retroflexion. The hemorrhoids       were small. Impression:               - The entire examined colon is normal.                           - Internal hemorrhoids.                           - No specimens collected. Moderate Sedation:  Moderate (conscious) sedation was administered by the endoscopy nurse       and supervised by the endoscopist. The following parameters were       monitored: oxygen saturation, heart rate, blood pressure, CO2       capnography and response to care. Total physician intraservice time was       23 minutes. Recommendation:           - Patient has a contact number available for                            emergencies. The signs and symptoms of potential                            delayed complications were discussed with the                            patient. Return to normal activities tomorrow.                            Written discharge instructions were provided to the                             patient.                           - Resume previous diet today.                           - Continue present medications.                           - Repeat colonoscopy in 5 years for screening                            purposes. Procedure Code(s):        --- Professional ---                           [phone], Colonoscopy, flexible; diagnostic, including                            collection of specimen(s) by brushing or washing,                            when performed (separate procedure)                           99152, Moderate sedation services provided by the                            same physician or other qualified health care                            professional performing the diagnostic or  therapeutic service that the sedation supports,                            requiring the presence of an independent trained                            observer to assist in the monitoring of the                            patient's level of consciousness and physiological                            status; initial 15 minutes of intraservice time,                            patient age 59 years or older                           [phone], Moderate sedation services; each additional                            15 minutes intraservice time Diagnosis Code(s):        --- Professional ---                           Z80.0, Family history of malignant neoplasm of                            digestive organs                           K64.8, Other hemorrhoids CPT copyright 2016 [employer]. All rights reserved. The codes documented in this report are preliminary and upon coder review may  be revised to meet current compliance requirements. Lionel December, MD Lionel December, MD 10/30/2017 9:55:20 AM This report has been signed electronically. Number of Addenda: 0

## 2017-10-30 NOTE — Discharge Instructions (Signed)
Colonoscopy, Adult, Care After This sheet gives you information about how to care for yourself after your procedure. Your doctor may also give you more specific instructions. If you have problems or questions, call your doctor. Follow these instructions at home: General instructions   For the first 24 hours after the procedure: ? Do not drive or use machinery. ? Do not sign important documents. ? Do not drink alcohol. ? Do your daily activities more slowly than normal. ? Eat foods that are soft and easy to digest. ? Rest often.  Take over-the-counter or prescription medicines only as told by your doctor.  It is up to you to get the results of your procedure. Ask your doctor, or the department performing the procedure, when your results will be ready. To help cramping and bloating:  Try walking around.  Put heat on your belly (abdomen) as told by your doctor. Use a heat source that your doctor recommends, such as a moist heat pack or a heating pad. ? Put a towel between your skin and the heat source. ? Leave the heat on for 20-30 minutes. ? Remove the heat if your skin turns bright red. This is especially important if you cannot feel pain, heat, or cold. You can get burned. Eating and drinking  Drink enough fluid to keep your pee (urine) clear or pale yellow.  Return to your normal diet as told by your doctor. Avoid heavy or fried foods that are hard to digest.  Avoid drinking alcohol for as long as told by your doctor. Contact a doctor if:  You have blood in your poop (stool) 2-3 days after the procedure. Get help right away if:  You have more than a small amount of blood in your poop.  You see large clumps of tissue (blood clots) in your poop.  Your belly is swollen.  You feel sick to your stomach (nauseous).  You throw up (vomit).  You have a fever.  You have belly pain that gets worse, and medicine does not help your pain. This information is not intended to  replace advice given to you by your health care provider. Make sure you discuss any questions you have with your health care provider. Document Released: 09/01/2010 Document Revised: 04/23/2016 Document Reviewed: 04/23/2016 Elsevier Interactive Patient Education  2017 [employer].   Resume usual medications and diet. Remember not to take ibuprofen and Aleve together. Use one or the other. No driving for 24 hours. Next screening exam in 5 years.   Hemorrhoids Hemorrhoids are swollen veins in and around the rectum or anus. Hemorrhoids can cause pain, itching, or bleeding. Most of the time, they do not cause serious problems. They usually get better with diet changes, lifestyle changes, and other home treatments. Follow these instructions at home: Eating and drinking  Eat foods that have fiber, such as whole grains, beans, nuts, fruits, and vegetables. Ask your doctor about taking products that have added fiber (fibersupplements).  Drink enough fluid to keep your pee (urine) clear or pale yellow. For Pain and Swelling  Take a warm-water bath (sitz bath) for 20 minutes to ease pain. Do this 3-4 times a day.  If directed, put ice on the painful area. It may be helpful to use ice between your warm baths. ? Put ice in a plastic bag. ? Place a towel between your skin and the bag. ? Leave the ice on for 20 minutes, 2-3 times a day. General instructions  Take over-the-counter and prescription medicines  only as told by your doctor. ? Medicated creams and medicines that are inserted into the anus (suppositories) may be used or applied as told.  Exercise often.  Go to the bathroom when you have the urge to poop (to have a bowel movement). Do not wait.  Avoid pushing too hard (straining) when you poop.  Keep the butt area dry and clean. Use wet toilet paper or moist paper towels.  Do not sit on the toilet for a long time. Contact a doctor if:  You have any of these: ? Pain and  swelling that do not get better with treatment or medicine. ? Bleeding that will not stop. ? Trouble pooping or you cannot poop. ? Pain or swelling outside the area of the hemorrhoids. This information is not intended to replace advice given to you by your health care provider. Make sure you discuss any questions you have with your health care provider.   High-Fiber Diet Fiber, also called dietary fiber, is a type of carbohydrate found in fruits, vegetables, whole grains, and beans. A high-fiber diet can have many health benefits. Your health care provider may recommend a high-fiber diet to help:  Prevent constipation. Fiber can make your bowel movements more regular.  Lower your cholesterol.  Relieve hemorrhoids, uncomplicated diverticulosis, or irritable bowel syndrome.  Prevent overeating as part of a weight-loss plan.  Prevent heart disease, type 2 diabetes, and certain cancers.  What is my plan? The recommended daily intake of fiber includes:  38 grams for men under age 76.  30 grams for men over age 75.  25 grams for women under age 36.  21 grams for women over age 55.  You can get the recommended daily intake of dietary fiber by eating a variety of fruits, vegetables, grains, and beans. Your health care provider may also recommend a fiber supplement if it is not possible to get enough fiber through your diet. What do I need to know about a high-fiber diet?  Fiber supplements have not been widely studied for their effectiveness, so it is better to get fiber through food sources.  Always check the fiber content on thenutrition facts label of any prepackaged food. Look for foods that contain at least 5 grams of fiber per serving.  Ask your dietitian if you have questions about specific foods that are related to your condition, especially if those foods are not listed in the following section.  Increase your daily fiber consumption gradually. Increasing your intake of  dietary fiber too quickly may cause bloating, cramping, or gas.  Drink plenty of water. Water helps you to digest fiber. What foods can I eat? Grains Whole-grain breads. Multigrain cereal. Oats and oatmeal. Brown rice. Barley. Bulgur wheat. Millet. Bran muffins. Popcorn. Rye wafer crackers. Vegetables Sweet potatoes. Spinach. Kale. Artichokes. Cabbage. Broccoli. Green peas. Carrots. Squash. Fruits Berries. Pears. Apples. Oranges. Avocados. Prunes and raisins. Dried figs. Meats and Other Protein Sources Navy, kidney, pinto, and soy beans. Split peas. Lentils. Nuts and seeds. Dairy Fiber-fortified yogurt. Beverages Fiber-fortified soy milk. Fiber-fortified orange juice. Other Fiber bars. The items listed above may not be a complete list of recommended foods or beverages. Contact your dietitian for more options. What foods are not recommended? Grains White bread. Pasta made with refined flour. White rice. Vegetables Fried potatoes. Canned vegetables. Well-cooked vegetables. Fruits Fruit juice. Cooked, strained fruit. Meats and Other Protein Sources Fatty cuts of meat. Fried Environmental education officer or fried fish. Dairy Milk. Yogurt. Cream cheese. Sour cream. Beverages Soft  drinks. Other Cakes and pastries. Butter and oils. The items listed above may not be a complete list of foods and beverages to avoid. Contact your dietitian for more information. What are some tips for including high-fiber foods in my diet?  Eat a wide variety of high-fiber foods.  Make sure that half of all grains consumed each day are whole grains.  Replace breads and cereals made from refined flour or white flour with whole-grain breads and cereals.  Replace white rice with brown rice, bulgur wheat, or millet.  Start the day with a breakfast that is high in fiber, such as a cereal that contains at least 5 grams of fiber per serving.  Use beans in place of meat in soups, salads, or pasta.  Eat high-fiber snacks, such  as berries, raw vegetables, nuts, or popcorn. This information is not intended to replace advice given to you by your health care provider. Make sure you discuss any questions you have with your health care provider. Document Released: 07/30/2005 Document Revised: 01/05/2016 Document Reviewed: 01/12/2014 Elsevier Interactive Patient Education  [employer]2018 Elsevier Inc.

## 2017-11-04 ENCOUNTER — Encounter (HOSPITAL_COMMUNITY): Payer: Self-pay | Admitting: Internal Medicine

## 2017-12-25 ENCOUNTER — Ambulatory Visit (INDEPENDENT_AMBULATORY_CARE_PROVIDER_SITE_OTHER): Payer: PRIVATE HEALTH INSURANCE | Admitting: Pediatrics

## 2017-12-25 ENCOUNTER — Encounter: Payer: Self-pay | Admitting: Pediatrics

## 2017-12-25 VITALS — BP 131/87 | HR 59 | Temp 98.1°F | Ht 67.0 in | Wt 165.0 lb

## 2017-12-25 DIAGNOSIS — L03113 Cellulitis of right upper limb: Secondary | ICD-10-CM

## 2017-12-25 DIAGNOSIS — L02413 Cutaneous abscess of right upper limb: Secondary | ICD-10-CM

## 2017-12-25 DIAGNOSIS — L0291 Cutaneous abscess, unspecified: Secondary | ICD-10-CM

## 2017-12-25 MED ORDER — SULFAMETHOXAZOLE-TRIMETHOPRIM 800-160 MG PO TABS
1.0000 | ORAL_TABLET | Freq: Two times a day (BID) | ORAL | 0 refills | Status: DC
Start: 2017-12-25 — End: 2018-05-01

## 2017-12-25 NOTE — Progress Notes (Addendum)
  Subjective:   Patient ID: Dylan Miller, male    DOB: October 31, 1960, 57 y.o.   MRN: [id number] CC: Spider bite HPI: Derrius Furtick is a 57 y.o. male  About a week ago had an insect bite to his right forearm.  Since then he has had increasing redness.  He has had some drainage, thick and white over the last couple days.  Has been soaking in Epsom salts which helps with the drainage.  Still has a great deal tenderness associated with the area.  Is been bothering him at work.  No fevers.  Normal appetite. Relevant past medical, surgical, family and social history reviewed. Allergies and medications reviewed and updated. Social History   Tobacco Use  Smoking Status Former Smoker  Smokeless Tobacco Never Used   ROS: Per HPI   Objective:    BP 131/87   Pulse (!) 59   Temp 98.1 F (36.7 C) (Oral)   Ht  (1.702 m)   Wt 165 lb (74.8 kg)   BMI 25.84 kg/m   Wt Readings from Last 3 Encounters:  12/25/17 165 lb (74.8 kg)  10/30/17 160 lb (72.6 kg)  05/13/17 160 lb (72.6 kg)    Gen: NAD, alert, cooperative with exam, NCAT EYES: EOMI, no conjunctival injection, or no icterus CV: NRRR, normal S1/S2, no murmur, distal pulses 2+ b/l Resp: CTABL, no wheezes, normal WOB Abd: +BS, soft, NTND. no guarding or organomegaly Ext: No edema, warm Neuro: Alert and oriented Skin: 1 cm nodule with some fluctuance, induration, right forearm, not actively draining.  2 cm surrounding erythema, minimal induration.  Assessment & Plan:  57 year old male presents with insect bite and cellulitis. Diagnoses and all orders for this visit:  Cellulitis of right upper extremity Discussed options, continuing at some salt soaks versus doing incision and drainage.  Patient wants to proceed with the incision and drainage, given the amount of soreness that has been ongoing. -     sulfamethoxazole-trimethoprim (BACTRIM DS,SEPTRA DS) 800-160 MG tablet; Take 1 tablet by mouth 2 (two) times daily.  Abscess -      Anaerobic and Aerobic Culture  PROCEDURE: After risks, benefits and alternatives discussed with pt and pt decided to procedure with I and D of likely abscess, area was prepped with betadine. 1 mL of lidocaine with epi was injected below the skin. #11 scalpel was used to make a 3mm incision at surface of nodule with return of some purulence and blood. Pt tolerated procedure well.  Wound care discussed.   Follow up plan: Return if symptoms worsen or fail to improve. Rex Kras, MD [facility]

## 2017-12-30 LAB — ANAEROBIC AND AEROBIC CULTURE

## 2017-12-31 ENCOUNTER — Telehealth: Payer: Self-pay | Admitting: Family Medicine

## 2017-12-31 NOTE — Telephone Encounter (Signed)
Patient calling to get results of culture of abcess.  Will you please take a look at them, from 12/25/17.

## 2017-12-31 NOTE — Telephone Encounter (Signed)
Grew staph- was given bactrim- good coverage

## 2018-01-01 NOTE — Telephone Encounter (Signed)
lmtcb

## 2018-01-02 ENCOUNTER — Telehealth: Payer: Self-pay | Admitting: Family Medicine

## 2018-01-02 NOTE — Telephone Encounter (Signed)
Patient aware and verbalizes understanding. 

## 2018-01-02 NOTE — Telephone Encounter (Signed)
Patient aware of results.

## 2018-05-01 ENCOUNTER — Encounter: Payer: Self-pay | Admitting: Family Medicine

## 2018-05-01 ENCOUNTER — Ambulatory Visit (INDEPENDENT_AMBULATORY_CARE_PROVIDER_SITE_OTHER): Payer: PRIVATE HEALTH INSURANCE | Admitting: Family Medicine

## 2018-05-01 VITALS — BP 144/82 | HR 66 | Temp 97.5°F | Ht 67.0 in | Wt 169.2 lb

## 2018-05-01 DIAGNOSIS — Z131 Encounter for screening for diabetes mellitus: Secondary | ICD-10-CM | POA: Diagnosis not present

## 2018-05-01 DIAGNOSIS — Z125 Encounter for screening for malignant neoplasm of prostate: Secondary | ICD-10-CM | POA: Diagnosis not present

## 2018-05-01 DIAGNOSIS — Z0000 Encounter for general adult medical examination without abnormal findings: Secondary | ICD-10-CM | POA: Diagnosis not present

## 2018-05-01 LAB — BAYER DCA HB A1C WAIVED: HB A1C: 5.5 % (ref <7.0)

## 2018-05-01 NOTE — Progress Notes (Signed)
BP (!) 144/82   Pulse 66   Temp (!) 97.5 F (36.4 C) (Oral)   Ht 5' 7"  (1.702 m)   Wt 169 lb 3.2 oz (76.7 kg)   BMI 26.50 kg/m    Subjective:    Patient ID: Dylan Miller, male    DOB: 09-18-1960, 57 y.o.   MRN: [id number]  HPI: Dylan Miller is a 56 y.o. male presenting on 05/01/2018 for Annual Exam   HPI Well adult exam and physical Patient is coming in for well adult exam and physical.  He denies any major health issues or health concerns and has been doing relatively very well.  He tries to stay somewhat active. Patient denies any chest pain, shortness of breath, headaches or vision issues, abdominal complaints, diarrhea, nausea, vomiting, or joint issues.   Relevant past medical, surgical, family and social history reviewed and updated as indicated. Interim medical history since our last visit reviewed. Allergies and medications reviewed and updated.  Review of Systems  Constitutional: Negative for chills and fever.  Eyes: Negative for visual disturbance.  Respiratory: Negative for shortness of breath and wheezing.   Cardiovascular: Negative for chest pain and leg swelling.  Musculoskeletal: Negative for back pain and gait problem.  Skin: Negative for rash.  Neurological: Negative for dizziness, weakness, light-headedness and headaches.  All other systems reviewed and are negative.   Per HPI unless specifically indicated above   Allergies as of 05/01/2018   No Known Allergies     Medication List        Accurate as of 05/01/18  2:59 PM. Always use your most recent med list.          ibuprofen 200 MG tablet Commonly known as:  ADVIL,MOTRIN Take 200 mg by mouth daily as needed for moderate pain.   naproxen sodium 220 MG tablet Commonly known as:  ALEVE Take 220 mg by mouth daily as needed (pain).          Objective:    BP (!) 144/82   Pulse 66   Temp (!) 97.5 F (36.4 C) (Oral)   Ht 5' 7"  (1.702 m)   Wt 169 lb 3.2 oz (76.7 kg)   BMI 26.50  kg/m   Wt Readings from Last 3 Encounters:  05/01/18 169 lb 3.2 oz (76.7 kg)  12/25/17 165 lb (74.8 kg)  10/30/17 160 lb (72.6 kg)    Physical Exam  Constitutional: He is oriented to person, place, and time. He appears well-developed and well-nourished. No distress.  HENT:  Right Ear: External ear normal.  Left Ear: External ear normal.  Nose: Nose normal.  Mouth/Throat: Oropharynx is clear and moist. No oropharyngeal exudate.  Eyes: Pupils are equal, round, and reactive to light. Conjunctivae and EOM are normal. Right eye exhibits no discharge. No scleral icterus.  Neck: Neck supple. No thyromegaly present.  Cardiovascular: Normal rate, regular rhythm, normal heart sounds and intact distal pulses.  No murmur heard. Pulmonary/Chest: Effort normal and breath sounds normal. No respiratory distress. He has no wheezes.  Abdominal: Soft. Bowel sounds are normal. He exhibits no distension. There is no tenderness. There is no rebound and no guarding.  Musculoskeletal: Normal range of motion. He exhibits no edema.  Lymphadenopathy:    He has no cervical adenopathy.  Neurological: He is alert and oriented to person, place, and time. Coordination normal.  Skin: Skin is warm and dry. No rash noted. He is not diaphoretic.  Psychiatric: He has a normal mood and affect. His  behavior is normal.  Nursing note and vitals reviewed.      Assessment & Plan:   Problem List Items Addressed This Visit    None    Visit Diagnoses    Well adult exam    -  Primary   Relevant Orders   CBC with Differential/Platelet   CMP14+EGFR   Lipid panel   Bayer DCA Hb A1c Waived   PSA, total and free   Prostate cancer screening       Diabetes mellitus screening          Follow up plan: Return in about 1 year (around 05/02/2019), or if symptoms worsen or fail to improve.  Counseling provided for all of the vaccine components Orders Placed This Encounter  Procedures  . CBC with Differential/Platelet  .  CMP14+EGFR  . Lipid panel  . Bayer DCA Hb A1c Waived  . PSA, total and free    Caryl Pina, MD [facility] 05/01/2018, 2:59 PM

## 2018-05-02 LAB — CBC WITH DIFFERENTIAL/PLATELET
BASOS ABS: 0.1 10*3/uL (ref 0.0–0.2)
Basos: 2 %
EOS (ABSOLUTE): 0.2 10*3/uL (ref 0.0–0.4)
EOS: 2 %
HEMATOCRIT: 41.1 % (ref 37.5–51.0)
HEMOGLOBIN: 13.9 g/dL (ref 13.0–17.7)
Immature Grans (Abs): 0 10*3/uL (ref 0.0–0.1)
Immature Granulocytes: 0 %
LYMPHS ABS: 2.5 10*3/uL (ref 0.7–3.1)
Lymphs: 34 %
MCH: 30.1 pg (ref 26.6–33.0)
MCHC: 33.8 g/dL (ref 31.5–35.7)
MCV: 89 fL (ref 79–97)
MONOCYTES: 7 %
Monocytes Absolute: 0.5 10*3/uL (ref 0.1–0.9)
NEUTROS ABS: 4.1 10*3/uL (ref 1.4–7.0)
Neutrophils: 55 %
Platelets: 294 10*3/uL (ref 150–450)
RBC: 4.62 x10E6/uL (ref 4.14–5.80)
RDW: 13.2 % (ref 12.3–15.4)
WBC: 7.3 10*3/uL (ref 3.4–10.8)

## 2018-05-02 LAB — CMP14+EGFR
A/G RATIO: 2.1 (ref 1.2–2.2)
ALBUMIN: 4.7 g/dL (ref 3.5–5.5)
ALK PHOS: 63 IU/L (ref 39–117)
ALT: 28 IU/L (ref 0–44)
AST: 27 IU/L (ref 0–40)
BILIRUBIN TOTAL: 0.5 mg/dL (ref 0.0–1.2)
BUN / CREAT RATIO: 10 (ref 9–20)
BUN: 11 mg/dL (ref 6–24)
CO2: 22 mmol/L (ref 20–29)
CREATININE: 1.11 mg/dL (ref 0.76–1.27)
Calcium: 9.7 mg/dL (ref 8.7–10.2)
Chloride: 100 mmol/L (ref 96–106)
GFR calc non Af Amer: 73 mL/min/{1.73_m2} (ref >59)
GFR, EST AFRICAN AMERICAN: 85 mL/min/{1.73_m2} (ref >59)
Globulin, Total: 2.2 g/dL (ref 1.5–4.5)
Glucose: 84 mg/dL (ref 65–99)
Potassium: 4.6 mmol/L (ref 3.5–5.2)
SODIUM: 140 mmol/L (ref 134–144)
Total Protein: 6.9 g/dL (ref 6.0–8.5)

## 2018-05-02 LAB — PSA, TOTAL AND FREE
PROSTATE SPECIFIC AG, SERUM: 2.4 ng/mL (ref 0.0–4.0)
PSA FREE PCT: 12.5 %
PSA FREE: 0.3 ng/mL

## 2018-05-02 LAB — LIPID PANEL
CHOLESTEROL TOTAL: 214 mg/dL — AB (ref 100–199)
Chol/HDL Ratio: 4.2 ratio (ref 0.0–5.0)
HDL: 51 mg/dL (ref >39)
LDL Calculated: 116 mg/dL — ABNORMAL HIGH (ref 0–99)
Triglycerides: 236 mg/dL — ABNORMAL HIGH (ref 0–149)
VLDL Cholesterol Cal: 47 mg/dL — ABNORMAL HIGH (ref 5–40)

## 2018-05-05 ENCOUNTER — Telehealth: Payer: Self-pay | Admitting: Family Medicine

## 2018-05-05 NOTE — Telephone Encounter (Signed)
Patient aware of labs.  

## 2018-05-05 NOTE — Telephone Encounter (Signed)
lmtcb

## 2018-08-11 IMAGING — CR DG HAND COMPLETE 3+V*L*
3 series · 3 of 3 positions shown · non-contrast
Comparison: No recent prior.

CLINICAL DATA: Injury.

EXAM:
LEFT HAND - COMPLETE 3+ VIEW

[view not recorded (1 of 3)]
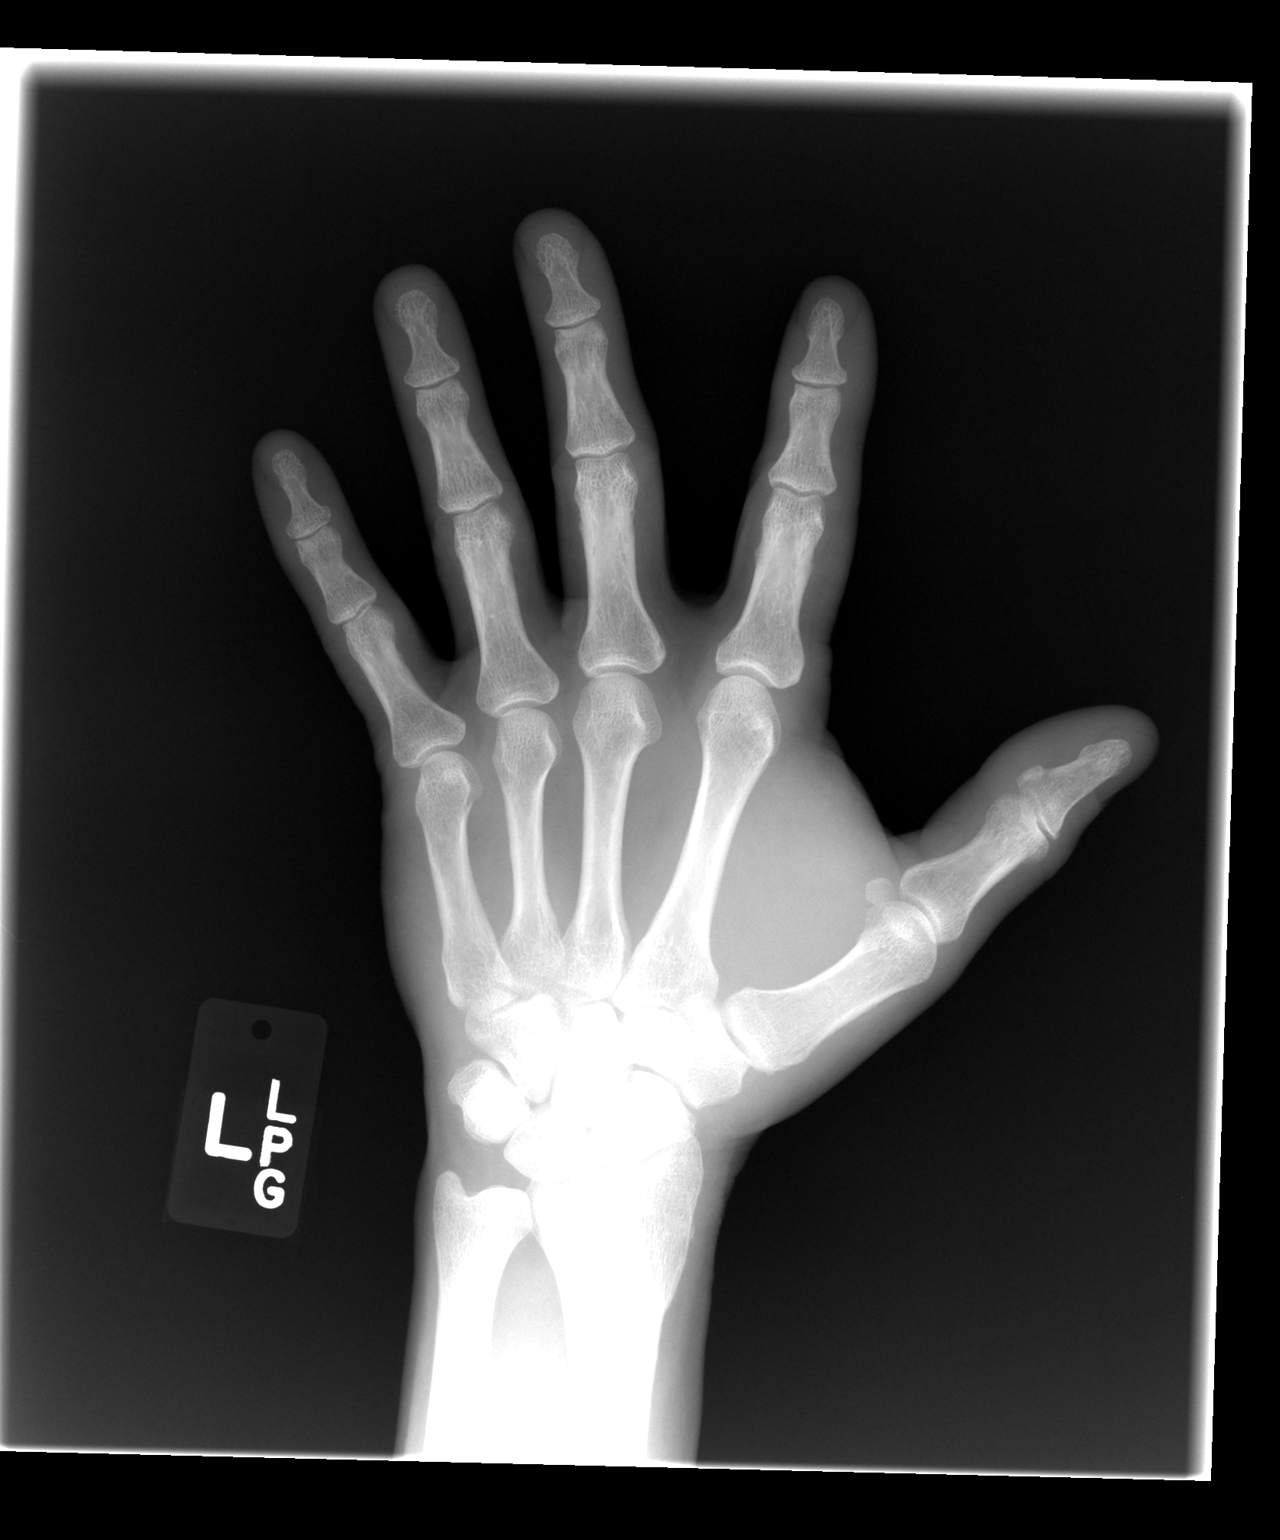

[view not recorded (2 of 3)]
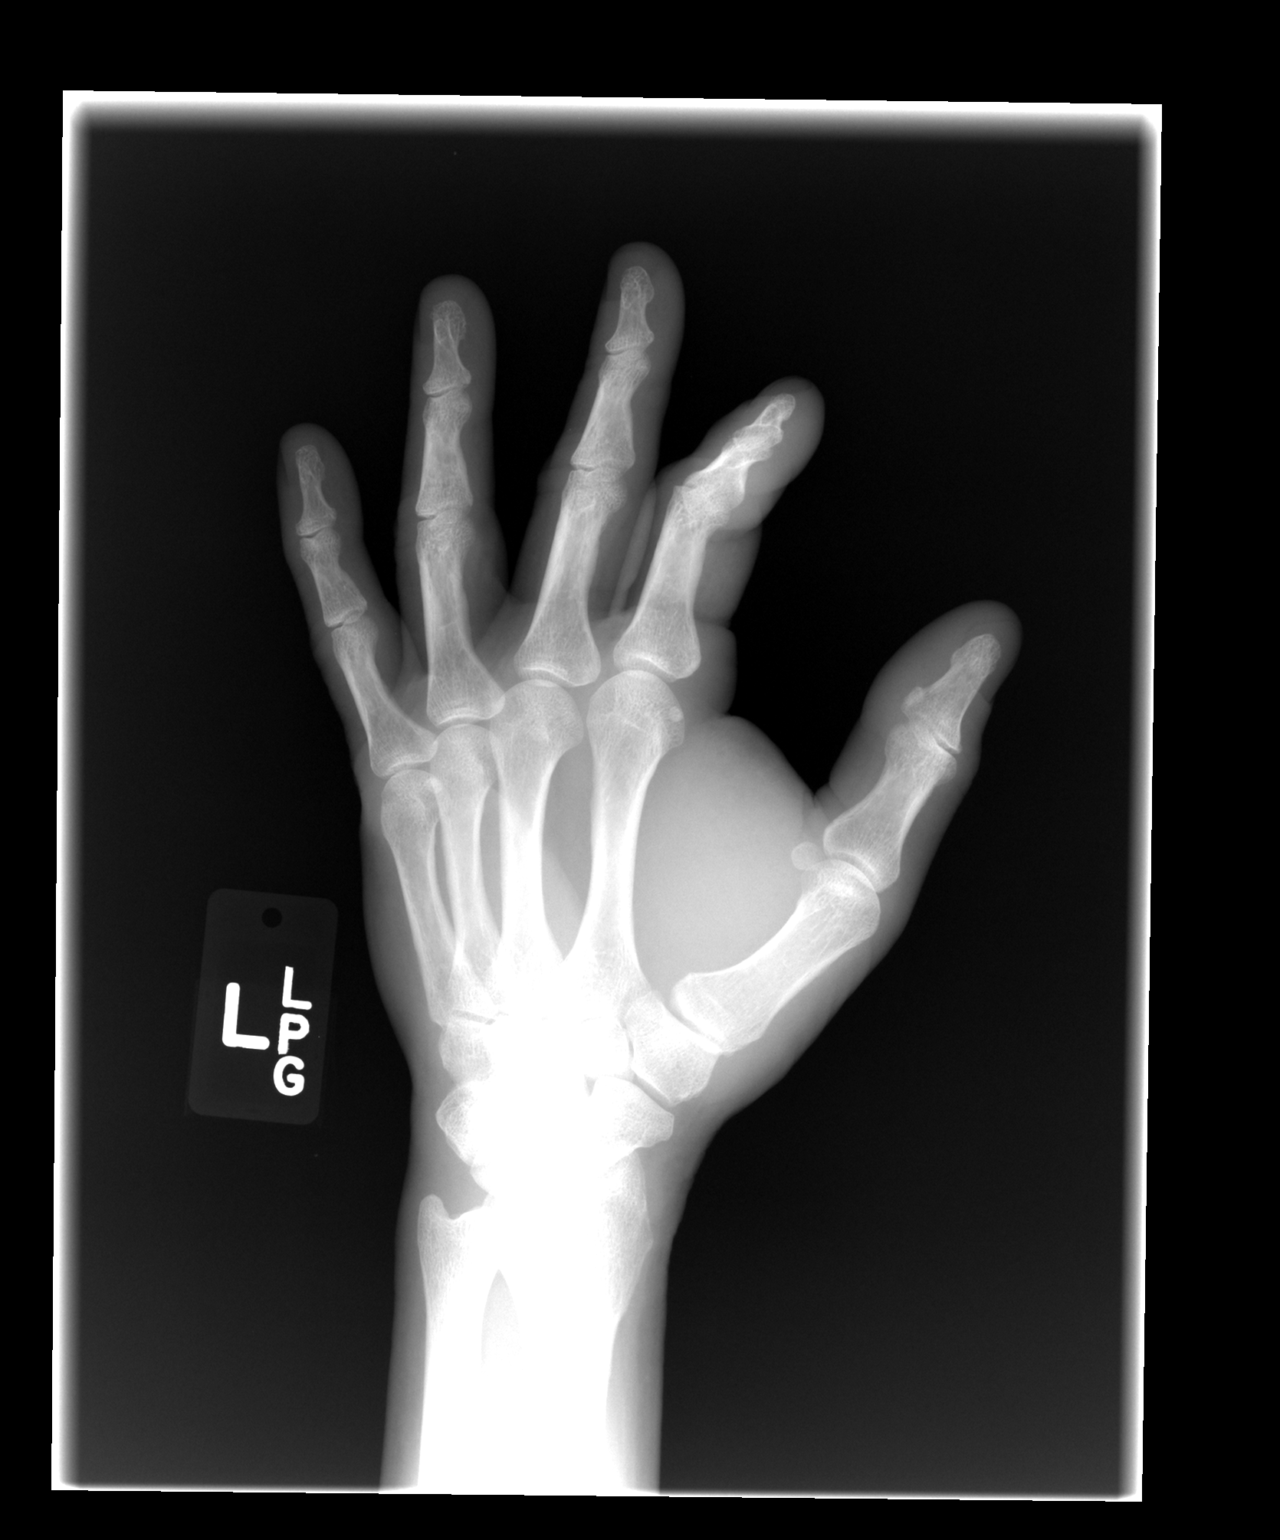

[view not recorded (3 of 3)]
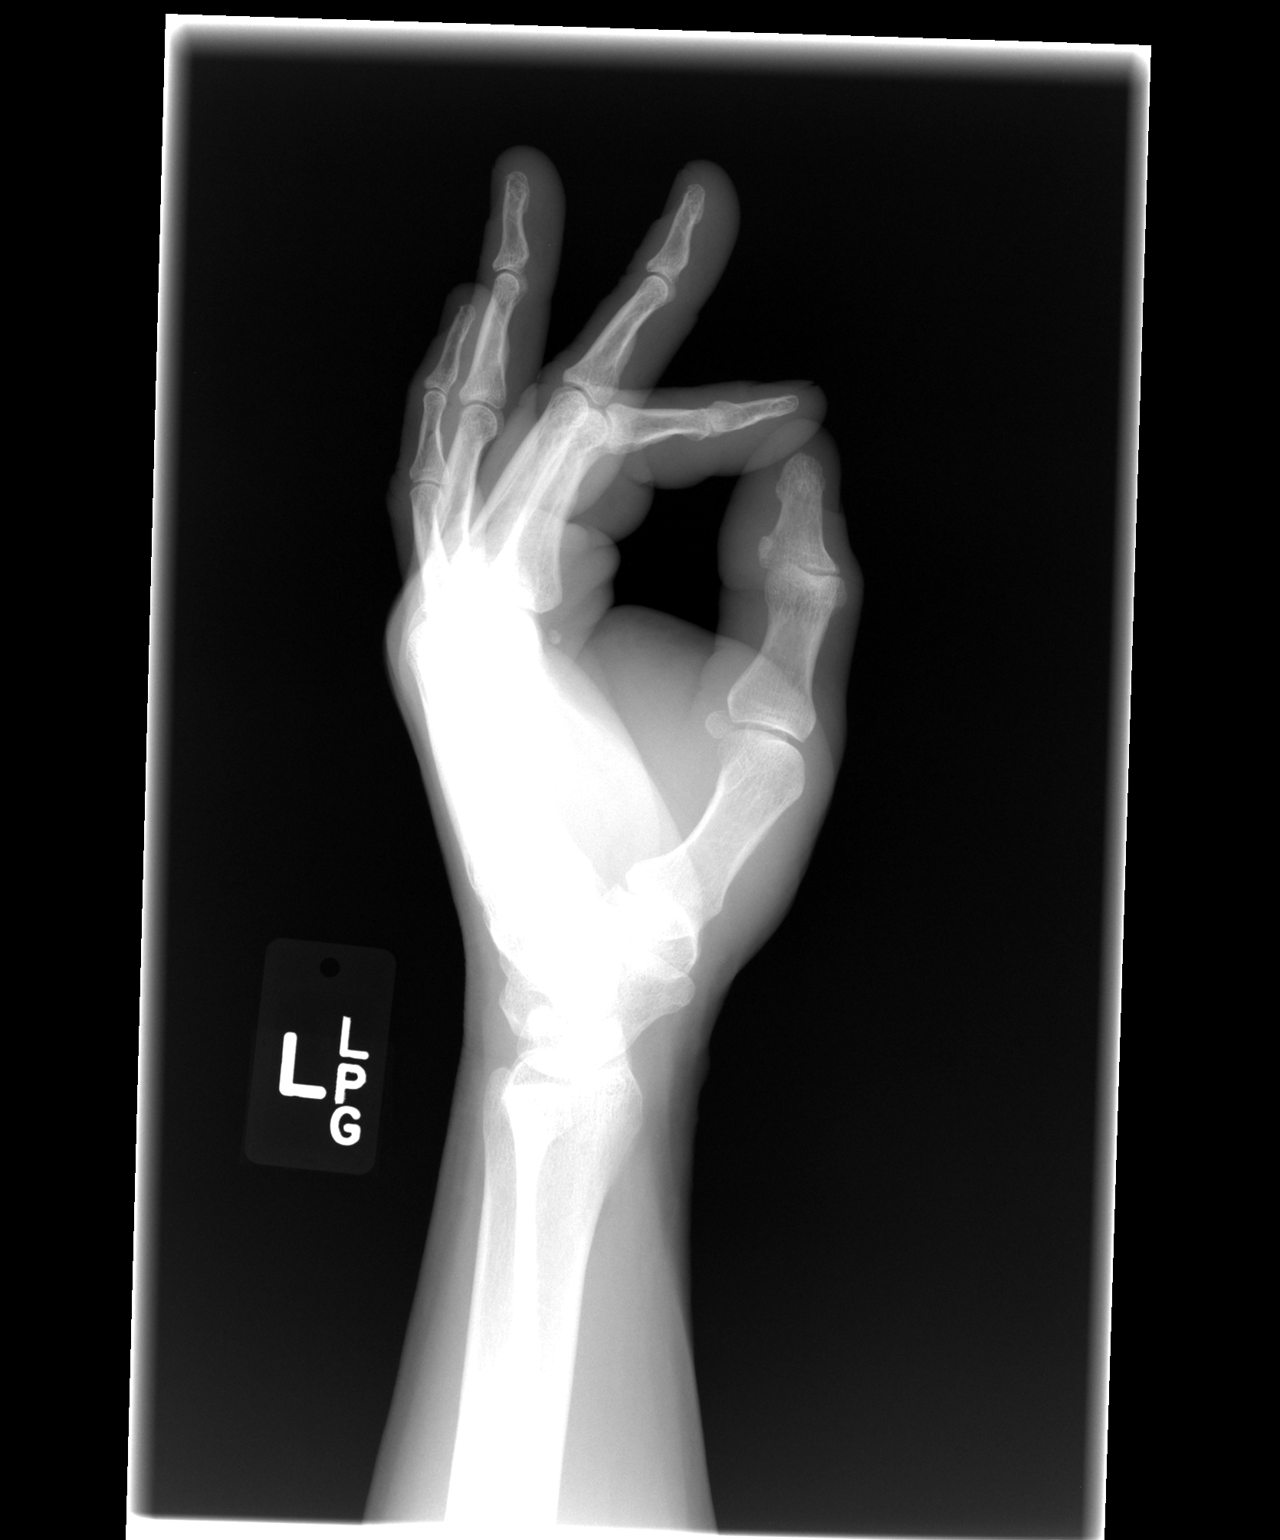

[3 of 3 positions shown; findings below may reference images not displayed]

FINDINGS: No acute bony or joint abnormality identified. No evidence of
fracture or dislocation.
IMPRESSION: No acute or focal abnormality .

## 2019-04-23 ENCOUNTER — Ambulatory Visit (INDEPENDENT_AMBULATORY_CARE_PROVIDER_SITE_OTHER): Payer: PRIVATE HEALTH INSURANCE | Admitting: Nurse Practitioner

## 2019-04-23 ENCOUNTER — Encounter: Payer: Self-pay | Admitting: Nurse Practitioner

## 2019-04-23 DIAGNOSIS — L03119 Cellulitis of unspecified part of limb: Secondary | ICD-10-CM | POA: Diagnosis not present

## 2019-04-23 MED ORDER — CEPHALEXIN 500 MG PO CAPS: 500.0000 mg | ORAL_CAPSULE | Freq: Three times a day (TID) | ORAL | 0 refills | Status: DC

## 2019-04-23 NOTE — Progress Notes (Signed)
Virtual Visit via telephone Note Due to COVID-19 pandemic this visit was conducted virtually. This visit type was conducted due to national recommendations for restrictions regarding the COVID-19 Pandemic (e.g. social distancing, sheltering in place) in an effort to limit this patient's exposure and mitigate transmission in our community. All issues noted in this document were discussed and addressed.  A physical exam was not performed with this format.  I connected with Dylan Miller on 04/23/19 at 9:05 by telephone and verified that I am speaking with the correct person using two identifiers. Dylan Miller is currently located at home and no one is currently with him during visit. The provider, Mary-Margaret Hassell Done, FNP is located in their office at time of visit.  I discussed the limitations, risks, security and privacy concerns of performing an evaluation and management service by telephone and the availability of in person appointments. I also discussed with the patient that there may be a patient responsible charge related to this service. The patient expressed understanding and agreed to proceed.   History and Present Illness:  Patient states that he was wearing some boots to work earlier this week and rubbed blisters on both ankles. The blisters have turned very red and are droning foul smelling brownish discharge. He has been soaking in epsom salt the last 24 hours and putting antibiotic ointment on them.    Review of Systems  Constitutional: Negative for diaphoresis and weight loss.  Eyes: Negative for blurred vision, double vision and pain.  Respiratory: Negative for shortness of breath.   Cardiovascular: Negative for chest pain, palpitations, orthopnea and leg swelling.  Gastrointestinal: Negative for abdominal pain.  Skin: Negative for rash.       Blisters on ankles  Neurological: Negative for dizziness, sensory change, loss of consciousness, weakness and headaches.   Endo/Heme/Allergies: Negative for polydipsia. Does not bruise/bleed easily.  Psychiatric/Behavioral: Negative for memory loss. The patient does not have insomnia.   All other systems reviewed and are negative.    Observations/Objective: Alert and oriented- answers all questions appropriately No distress bil ankles edematous with large bliters on both- drainage is foul smelling and brownish in color- according to patient   Assessment and Plan: Dylan Miller in today with chief complaint of Edema   1. Cellulitis of ankle Avoid wearing shoes that caused problem Continue epsom salt soaks BID Keep clean and dry Avoid scratching or picking at area Meds ordered this encounter  Medications  . cephALEXin (KEFLEX) 500 MG capsule    Sig: Take 1 capsule (500 mg total) by mouth 3 (three) times daily.    Dispense:  15 capsule    Refill:  0    Order Specific Question:   Supervising Provider    Answer:   Caryl Pina A [[id number]]      Follow Up Instructions: prn    I discussed the assessment and treatment plan with the patient. The patient was provided an opportunity to ask questions and all were answered. The patient agreed with the plan and demonstrated an understanding of the instructions.   The patient was advised to call back or seek an in-person evaluation if the symptoms worsen or if the condition fails to improve as anticipated.  The above assessment and management plan was discussed with the patient. The patient verbalized understanding of and has agreed to the management plan. Patient is aware to call the clinic if symptoms persist or worsen. Patient is aware when to return to the clinic for a follow-up visit. Patient  educated on when it is appropriate to go to the emergency department.   Time call ended:  9:17  I provided 12 minutes of non-face-to-face time during this encounter.    Mary-Margaret Daphine DeutscherMartin, FNP

## 2019-04-27 ENCOUNTER — Other Ambulatory Visit: Payer: Self-pay | Admitting: Nurse Practitioner

## 2019-04-28 ENCOUNTER — Other Ambulatory Visit: Payer: Self-pay | Admitting: Family Medicine

## 2019-04-28 MED ORDER — CEPHALEXIN 500 MG PO CAPS: 500.0000 mg | ORAL_CAPSULE | Freq: Three times a day (TID) | ORAL | 0 refills | Status: DC

## 2019-04-28 NOTE — Telephone Encounter (Signed)
Go ahead and give a one-time refill of Keflex, if it is not improved from there then have him come in and be seen

## 2019-04-28 NOTE — Telephone Encounter (Signed)
Refill sent. Patient aware.  

## 2019-05-27 ENCOUNTER — Other Ambulatory Visit: Payer: Self-pay

## 2019-05-28 ENCOUNTER — Ambulatory Visit (INDEPENDENT_AMBULATORY_CARE_PROVIDER_SITE_OTHER): Payer: PRIVATE HEALTH INSURANCE | Admitting: Family Medicine

## 2019-05-28 ENCOUNTER — Encounter: Payer: Self-pay | Admitting: Family Medicine

## 2019-05-28 ENCOUNTER — Other Ambulatory Visit: Payer: Self-pay

## 2019-05-28 VITALS — BP 130/85 | HR 63 | Temp 98.0°F | Ht 67.0 in | Wt 170.8 lb

## 2019-05-28 DIAGNOSIS — Z125 Encounter for screening for malignant neoplasm of prostate: Secondary | ICD-10-CM

## 2019-05-28 DIAGNOSIS — Z1159 Encounter for screening for other viral diseases: Secondary | ICD-10-CM

## 2019-05-28 DIAGNOSIS — Z0000 Encounter for general adult medical examination without abnormal findings: Secondary | ICD-10-CM | POA: Diagnosis not present

## 2019-05-28 DIAGNOSIS — Z23 Encounter for immunization: Secondary | ICD-10-CM

## 2019-05-28 NOTE — Addendum Note (Signed)
Addended by: Karle Plumber on: 05/28/2019 03:38 PM   Modules accepted: Orders

## 2019-05-28 NOTE — Progress Notes (Signed)
BP 130/85   Pulse 63   Temp 98 F (36.7 C) (Temporal)   Ht 5' 7" (1.702 m)   Wt 170 lb 12.8 oz (77.5 kg)   SpO2 98%   BMI 26.75 kg/m    Subjective:   Patient ID: Dylan Miller, male    DOB: 07-05-1961, 58 y.o.   MRN: [id number]  HPI: Dylan Miller is a 58 y.o. male presenting on 05/28/2019 for Annual Exam   HPI Patient is coming in for adult well exam and physical.  He has been fighting some blisters on his ankle we gave him some ideas about using moleskin to help. Patient denies any chest pain, shortness of breath, headaches or vision issues, abdominal complaints, diarrhea, nausea, vomiting, or joint issues.   Relevant past medical, surgical, family and social history reviewed and updated as indicated. Interim medical history since our last visit reviewed. Allergies and medications reviewed and updated.  Review of Systems  Constitutional: Negative for chills and fever.  HENT: Negative for ear pain and tinnitus.   Eyes: Negative for pain.  Respiratory: Negative for cough, shortness of breath and wheezing.   Cardiovascular: Negative for chest pain, palpitations and leg swelling.  Gastrointestinal: Negative for abdominal pain, blood in stool, constipation and diarrhea.  Genitourinary: Negative for dysuria and hematuria.  Musculoskeletal: Negative for back pain and myalgias.  Skin: Negative for rash.  Neurological: Negative for dizziness, weakness and headaches.  Psychiatric/Behavioral: Negative for suicidal ideas.    Per HPI unless specifically indicated above   Allergies as of 05/28/2019   No Known Allergies     Medication List       Accurate as of May 28, 2019  3:18 PM. If you have any questions, ask your nurse or doctor.        STOP taking these medications   cephALEXin 500 MG capsule Commonly known as: Keflex Stopped by: Fransisca Kaufmann Angelisa Winthrop, MD     TAKE these medications   ibuprofen 200 MG tablet Commonly known as: ADVIL Take 200 mg by mouth  daily as needed for moderate pain.   naproxen sodium 220 MG tablet Commonly known as: ALEVE Take 220 mg by mouth daily as needed (pain).        Objective:   BP 130/85   Pulse 63   Temp 98 F (36.7 C) (Temporal)   Ht 5' 7" (1.702 m)   Wt 170 lb 12.8 oz (77.5 kg)   SpO2 98%   BMI 26.75 kg/m   Wt Readings from Last 3 Encounters:  05/28/19 170 lb 12.8 oz (77.5 kg)  05/01/18 169 lb 3.2 oz (76.7 kg)  12/25/17 165 lb (74.8 kg)    Physical Exam Vitals signs and nursing note reviewed.  Constitutional:      General: He is not in acute distress.    Appearance: He is well-developed. He is not diaphoretic.  HENT:     Right Ear: External ear normal.     Left Ear: External ear normal.     Nose: Nose normal.     Mouth/Throat:     Pharynx: No oropharyngeal exudate.  Eyes:     General: No scleral icterus.       Right eye: No discharge.     Conjunctiva/sclera: Conjunctivae normal.     Pupils: Pupils are equal, round, and reactive to light.  Neck:     Musculoskeletal: Neck supple.     Thyroid: No thyromegaly.  Cardiovascular:     Rate and Rhythm: Normal rate  and regular rhythm.     Heart sounds: Normal heart sounds. No murmur.  Pulmonary:     Effort: Pulmonary effort is normal. No respiratory distress.     Breath sounds: Normal breath sounds. No wheezing.  Abdominal:     General: Bowel sounds are normal. There is no distension.     Palpations: Abdomen is soft.     Tenderness: There is no abdominal tenderness. There is no guarding or rebound.  Genitourinary:    Prostate: Normal.     Rectum: Normal.  Musculoskeletal: Normal range of motion.  Lymphadenopathy:     Cervical: No cervical adenopathy.  Skin:    General: Skin is warm and dry.     Findings: No rash.  Neurological:     Mental Status: He is alert and oriented to person, place, and time.     Coordination: Coordination normal.  Psychiatric:        Behavior: Behavior normal.       Assessment & Plan:   Problem  List Items Addressed This Visit    None    Visit Diagnoses    Well adult exam    -  Primary   Relevant Orders   CBC with Differential/Platelet   CMP14+EGFR   Lipid panel   Prostate cancer screening       Relevant Orders   PSA, total and free   Need for hepatitis C screening test       Relevant Orders   Hepatitis C antibody    Do blood work and have patient follow-up in a year unless there is anything off of the blood work.  Follow up plan: Return in about 1 year (around 05/27/2020), or if symptoms worsen or fail to improve, for well adult.  Counseling provided for all of the vaccine components Orders Placed This Encounter  Procedures  . CBC with Differential/Platelet  . CMP14+EGFR  . Lipid panel  . Hepatitis C antibody  . PSA, total and free    Caryl Pina, MD [facility] 05/28/2019, 3:18 PM

## 2019-06-02 MED ORDER — ATORVASTATIN CALCIUM 20 MG PO TABS: 20.0000 mg | ORAL_TABLET | Freq: Every day | ORAL | 3 refills | Status: DC

## 2019-06-02 NOTE — Addendum Note (Signed)
Addended by: Nigel Berthold C on: 06/02/2019 03:06 PM   Modules accepted: Orders

## 2019-06-25 NOTE — Addendum Note (Signed)
Addended by: Nigel Berthold C on: 06/25/2019 10:43 AM   Modules accepted: Orders

## 2019-07-02 ENCOUNTER — Other Ambulatory Visit: Payer: PRIVATE HEALTH INSURANCE

## 2019-07-02 ENCOUNTER — Other Ambulatory Visit: Payer: Self-pay

## 2019-07-02 DIAGNOSIS — Z1159 Encounter for screening for other viral diseases: Secondary | ICD-10-CM

## 2019-07-02 DIAGNOSIS — Z125 Encounter for screening for malignant neoplasm of prostate: Secondary | ICD-10-CM

## 2019-07-02 DIAGNOSIS — Z0000 Encounter for general adult medical examination without abnormal findings: Secondary | ICD-10-CM

## 2019-07-03 LAB — CMP14+EGFR
ALT: 53 IU/L — ABNORMAL HIGH (ref 0–44)
AST: 44 IU/L — ABNORMAL HIGH (ref 0–40)
Albumin/Globulin Ratio: 1.9 (ref 1.2–2.2)
Albumin: 4.7 g/dL (ref 3.8–4.9)
Alkaline Phosphatase: 69 IU/L (ref 39–117)
BUN/Creatinine Ratio: 9 (ref 9–20)
BUN: 12 mg/dL (ref 6–24)
Bilirubin Total: 0.6 mg/dL (ref 0.0–1.2)
CO2: 21 mmol/L (ref 20–29)
Calcium: 9.6 mg/dL (ref 8.7–10.2)
Chloride: 104 mmol/L (ref 96–106)
Creatinine, Ser: 1.3 mg/dL — ABNORMAL HIGH (ref 0.76–1.27)
GFR calc Af Amer: 70 mL/min/{1.73_m2} (ref >59)
GFR calc non Af Amer: 60 mL/min/{1.73_m2} (ref >59)
Globulin, Total: 2.5 g/dL (ref 1.5–4.5)
Glucose: 93 mg/dL (ref 65–99)
Potassium: 4.6 mmol/L (ref 3.5–5.2)
Sodium: 140 mmol/L (ref 134–144)
Total Protein: 7.2 g/dL (ref 6.0–8.5)

## 2019-07-03 LAB — CBC WITH DIFFERENTIAL/PLATELET
Basophils Absolute: 0.1 10*3/uL (ref 0.0–0.2)
Basos: 1 %
EOS (ABSOLUTE): 0.1 10*3/uL (ref 0.0–0.4)
Eos: 1 %
Hematocrit: 40.9 % (ref 37.5–51.0)
Hemoglobin: 13.6 g/dL (ref 13.0–17.7)
Immature Grans (Abs): 0 10*3/uL (ref 0.0–0.1)
Immature Granulocytes: 0 %
Lymphocytes Absolute: 2 10*3/uL (ref 0.7–3.1)
Lymphs: 32 %
MCH: 29.6 pg (ref 26.6–33.0)
MCHC: 33.3 g/dL (ref 31.5–35.7)
MCV: 89 fL (ref 79–97)
Monocytes Absolute: 0.5 10*3/uL (ref 0.1–0.9)
Monocytes: 7 %
Neutrophils Absolute: 3.6 10*3/uL (ref 1.4–7.0)
Neutrophils: 59 %
Platelets: 257 10*3/uL (ref 150–450)
RBC: 4.6 x10E6/uL (ref 4.14–5.80)
RDW: 12.9 % (ref 11.6–15.4)
WBC: 6.3 10*3/uL (ref 3.4–10.8)

## 2019-07-03 LAB — HEPATITIS C ANTIBODY: Hep C Virus Ab: <0.1 s/co ratio (ref 0.0–0.9)

## 2019-07-03 LAB — LIPID PANEL
Chol/HDL Ratio: 4.7 ratio (ref 0.0–5.0)
Cholesterol, Total: 198 mg/dL (ref 100–199)
HDL: 42 mg/dL (ref >39)
LDL Chol Calc (NIH): 130 mg/dL — ABNORMAL HIGH (ref 0–99)
Triglycerides: 145 mg/dL (ref 0–149)
VLDL Cholesterol Cal: 26 mg/dL (ref 5–40)

## 2019-07-03 LAB — PSA, TOTAL AND FREE
PSA, Free Pct: 20.9 %
PSA, Free: 0.23 ng/mL
Prostate Specific Ag, Serum: 1.1 ng/mL (ref 0.0–4.0)

## 2019-07-22 ENCOUNTER — Telehealth: Payer: Self-pay | Admitting: Family Medicine

## 2019-07-22 NOTE — Telephone Encounter (Signed)
Spoke with pt and he wanted to know if he needed to have an appt for labs that he was told to come back in for and I advised him he didn't.

## 2020-04-11 ENCOUNTER — Other Ambulatory Visit: Payer: Self-pay

## 2020-04-11 ENCOUNTER — Ambulatory Visit
Admission: EM | Admit: 2020-04-11 | Discharge: 2020-04-11 | Disposition: A | Discharge status: Home / Self Care | Payer: PRIVATE HEALTH INSURANCE | Attending: Emergency Medicine | Admitting: Emergency Medicine

## 2020-04-11 ENCOUNTER — Encounter: Payer: Self-pay | Admitting: Emergency Medicine

## 2020-04-11 DIAGNOSIS — R21 Rash and other nonspecific skin eruption: Secondary | ICD-10-CM

## 2020-04-11 DIAGNOSIS — T7840XA Allergy, unspecified, initial encounter: Secondary | ICD-10-CM

## 2020-04-11 MED ORDER — PREDNISONE 20 MG PO TABS: 20.0000 mg | ORAL_TABLET | Freq: Two times a day (BID) | ORAL | 0 refills | Status: AC

## 2020-04-11 MED ORDER — HYDROXYZINE HCL 25 MG PO TABS: 25.0000 mg | ORAL_TABLET | Freq: Four times a day (QID) | ORAL | 0 refills | Status: DC

## 2020-04-11 MED ORDER — DEXAMETHASONE SODIUM PHOSPHATE 10 MG/ML IJ SOLN
10.0000 mg | Freq: Once | INTRAMUSCULAR | Status: AC
  Administered 2020-04-11: 10 mg via INTRAMUSCULAR

## 2020-04-11 NOTE — ED Provider Notes (Signed)
[facility]   [id number] 04/11/20 Arrival Time: 1012  CC: Rash; allergic reaction  SUBJECTIVE:  Dylan Miller is a 59 y.o. male who presents with a allergic reaction x couple of days.  Symptoms began after wearing a band-aid.  Localizes the rash to LT hand, specifically ring finger.  Describes it as itchy.  Has tried OTC steroid cream with relief.  Symptoms are made worse with scratching.  Reports similar symptoms in the past with allergic reaction to band-aid   Denies fever, chills, nausea, vomiting, SOB, chest pain.  ROS: As per HPI.  All other pertinent ROS negative.     Past Medical History:  Diagnosis Date  . Arthritis    Past Surgical History:  Procedure Laterality Date  . COLONOSCOPY N/A 10/30/2017   Procedure: COLONOSCOPY;  Surgeon: Malissa Hippo, MD;  Location: [facility];  Service: Endoscopy;  Laterality: N/A;  930   Allergies  Allergen Reactions  . Adhesive [Tape] Rash    BAND-AID    No current facility-administered medications on file prior to encounter.   Current Outpatient Medications on File Prior to Encounter  Medication Sig Dispense Refill  . atorvastatin (LIPITOR) 20 MG tablet Take 1 tablet (20 mg total) by mouth at bedtime. 90 tablet 3  . ibuprofen (ADVIL,MOTRIN) 200 MG tablet Take 200 mg by mouth daily as needed for moderate pain.    . naproxen sodium (ALEVE) 220 MG tablet Take 220 mg by mouth daily as needed (pain).     Social History   Socioeconomic History  . Marital status: Married    Spouse name: Not on file  . Number of children: Not on file  . Years of education: Not on file  . Highest education level: Not on file  Occupational History  . Not on file  Tobacco Use  . Smoking status: Former Games developer  . Smokeless tobacco: Never Used  Vaping Use  . Vaping Use: Never used  Substance and Sexual Activity  . Alcohol use: Yes    Alcohol/week: 6.0 standard drinks    Types: 6 Cans of beer per week  . Drug use: No  . Sexual  activity: Not on file  Other Topics Concern  . Not on file  Social History Narrative  . Not on file   Social Determinants of Health   Financial Resource Strain:   . Difficulty of Paying Living Expenses: Not on file  Food Insecurity:   . Worried About Programme researcher, broadcasting/film/video in the Last Year: Not on file  . Ran Out of Food in the Last Year: Not on file  Transportation Needs:   . Lack of Transportation (Medical): Not on file  . Lack of Transportation (Non-Medical): Not on file  Physical Activity:   . Days of Exercise per Week: Not on file  . Minutes of Exercise per Session: Not on file  Stress:   . Feeling of Stress : Not on file  Social Connections:   . Frequency of Communication with Friends and Family: Not on file  . Frequency of Social Gatherings with Friends and Family: Not on file  . Attends Religious Services: Not on file  . Active Member of Clubs or Organizations: Not on file  . Attends Banker Meetings: Not on file  . Marital Status: Not on file  Intimate Partner Violence:   . Fear of Current or Ex-Partner: Not on file  . Emotionally Abused: Not on file  . Physically Abused: Not on file  .  Sexually Abused: Not on file   Family History  Problem Relation Age of Onset  . Hypertension Mother   . Stroke Mother   . Cancer Father        esophgeal cancer  . Cancer Daughter        colon cancer    OBJECTIVE: Vitals:   04/11/20 1047 04/11/20 1049  BP:  133/77  Pulse:  82  Resp:  18  Temp:  98.4 F (36.9 C)  TempSrc:  Oral  SpO2:  98%  Weight: 169 lb 12.1 oz (77 kg)   Height: 5\' 7"  (1.702 m)     General appearance: alert; no distress Head: NCAT Lungs: normal respiratory effort CV: Radial pulse 2+  Skin: warm and dry; blister/ vesicles with mild surrounding erythema to base of RT ring finger, mild manifestations to proximal hand and distal forearm, NTTP, some clear drainage, no bleeding Psychological: alert and cooperative; normal mood and  affect  ASSESSMENT & PLAN:  1. Allergic reaction, initial encounter   2. Rash of hand     Meds ordered this encounter  Medications  . dexamethasone (DECADRON) injection 10 mg  . predniSONE (DELTASONE) 20 MG tablet    Sig: Take 1 tablet (20 mg total) by mouth 2 (two) times daily with a meal for 5 days.    Dispense:  10 tablet    Refill:  0    Order Specific Question:   Supervising Provider    Answer:   Eustace Moore  . hydrOXYzine (ATARAX/VISTARIL) 25 MG tablet    Sig: Take 1 tablet (25 mg total) by mouth every 6 (six) hours.    Dispense:  12 tablet    Refill:  0    Order Specific Question:   Supervising Provider    Answer:   [[id number]] Eustace Moore    Steroid shot given in office Prednisone prescribed.  Take as directed and to completion Hydroxyzine as needed for itching.  This medication may make you drowsy.  DO NOT TAKE prior to driving or operating heavy machinery Follow up with PCP as needed Return or go to the ED if you have any new or worsening symptoms such as difficulty breathing, SOB, chest pain, nausea, vomiting, throat tightness or swelling, tongue/lip swelling or tingling, abdominal pain, changes in bowel or bladder habits, etc...   Reviewed expectations re: course of current medical issues. Questions answered. Outlined signs and symptoms indicating need for more acute intervention. Patient verbalized understanding. After Visit Summary given.   [[id number]], PA-C 04/11/20 1104

## 2020-04-11 NOTE — Discharge Instructions (Signed)
Steroid shot given in office Prednisone prescribed.  Take as directed and to completion Hydroxyzine as needed for itching.  This medication may make you drowsy.  DO NOT TAKE prior to driving or operating heavy machinery Follow up with PCP as needed Return or go to the ED if you have any new or worsening symptoms such as difficulty breathing, SOB, chest pain, nausea, vomiting, throat tightness or swelling, tongue/lip swelling or tingling, abdominal pain, changes in bowel or bladder habits, etc..Marland Kitchen

## 2020-04-11 NOTE — ED Triage Notes (Signed)
Swelling to RT hand, blisters on his fingers and some on forearm.  Pt states he used a band aid recently and has had this happen before after using band aids.

## 2020-06-01 ENCOUNTER — Encounter: Payer: Self-pay | Admitting: Family Medicine

## 2020-06-01 ENCOUNTER — Other Ambulatory Visit: Payer: Self-pay

## 2020-06-01 ENCOUNTER — Ambulatory Visit (INDEPENDENT_AMBULATORY_CARE_PROVIDER_SITE_OTHER): Payer: PRIVATE HEALTH INSURANCE | Admitting: Family Medicine

## 2020-06-01 VITALS — BP 130/89 | HR 65 | Temp 97.2°F | Ht 67.0 in | Wt 173.5 lb

## 2020-06-01 DIAGNOSIS — Z0000 Encounter for general adult medical examination without abnormal findings: Secondary | ICD-10-CM

## 2020-06-01 NOTE — Progress Notes (Signed)
BP 130/89   Pulse 65   Temp (!) 97.2 F (36.2 C)   Ht 5' 7"  (1.702 m)   Wt 173 lb 8 oz (78.7 kg)   SpO2 100%   BMI 27.17 kg/m    Subjective:   Patient ID: [name], male    DOB: 1961/05/21, 59 y.o.   MRN: [id number]  HPI: Dylan Miller is a 59 y.o. male presenting on 06/01/2020 for Medical Management of Chronic Issues (CPE)   HPI Adult well exam and physical Patient is coming in for adult well exam and physical.  He denies any major health issues and says things are going really well.  He did stop his cholesterol pill because he is been exercising and feeling good so he wanted to see where he does with his blood work today. Patient denies any chest pain, shortness of breath, headaches or vision issues, abdominal complaints, diarrhea, nausea, vomiting, or joint issues.   Relevant past medical, surgical, family and social history reviewed and updated as indicated. Interim medical history since our last visit reviewed. Allergies and medications reviewed and updated.  Review of Systems  Constitutional: Negative for chills and fever.  HENT: Negative for ear pain and tinnitus.   Respiratory: Negative for cough, shortness of breath and wheezing.   Cardiovascular: Negative for chest pain, palpitations and leg swelling.  Gastrointestinal: Negative for abdominal pain, blood in stool, constipation and diarrhea.  Genitourinary: Negative for dysuria and hematuria.  Musculoskeletal: Negative for back pain and myalgias.  Skin: Negative for rash.  Neurological: Negative for dizziness, weakness and headaches.  Psychiatric/Behavioral: Negative for suicidal ideas.    Per HPI unless specifically indicated above   Allergies as of 06/01/2020      Reactions   Adhesive [tape] Rash   BAND-AID       Medication List       Accurate as of June 01, 2020  2:39 PM. If you have any questions, ask your nurse or doctor.        atorvastatin 20 MG tablet Commonly known as:  LIPITOR Take 1 tablet (20 mg total) by mouth at bedtime.   hydrOXYzine 25 MG tablet Commonly known as: ATARAX/VISTARIL Take 1 tablet (25 mg total) by mouth every 6 (six) hours.   ibuprofen 200 MG tablet Commonly known as: ADVIL Take 200 mg by mouth daily as needed for moderate pain.   naproxen sodium 220 MG tablet Commonly known as: ALEVE Take 220 mg by mouth daily as needed (pain).        Objective:   BP 130/89   Pulse 65   Temp (!) 97.2 F (36.2 C)   Ht 5' 7"  (1.702 m)   Wt 173 lb 8 oz (78.7 kg)   SpO2 100%   BMI 27.17 kg/m   Wt Readings from Last 3 Encounters:  06/01/20 173 lb 8 oz (78.7 kg)  04/11/20 169 lb 12.1 oz (77 kg)  05/28/19 170 lb 12.8 oz (77.5 kg)    Physical Exam Vitals reviewed.  Constitutional:      General: He is not in acute distress.    Appearance: He is well-developed. He is not diaphoretic.  HENT:     Right Ear: External ear normal.     Left Ear: External ear normal.     Nose: Nose normal.     Mouth/Throat:     Pharynx: No oropharyngeal exudate.  Eyes:     General: No scleral icterus.    Conjunctiva/sclera: Conjunctivae normal.  Neck:  Thyroid: No thyromegaly.  Cardiovascular:     Rate and Rhythm: Normal rate and regular rhythm.     Heart sounds: Normal heart sounds. No murmur heard.   Pulmonary:     Effort: Pulmonary effort is normal. No respiratory distress.     Breath sounds: Normal breath sounds. No wheezing.  Abdominal:     General: Bowel sounds are normal. There is no distension.     Palpations: Abdomen is soft.     Tenderness: There is no abdominal tenderness. There is no guarding or rebound.  Musculoskeletal:        General: Normal range of motion.     Cervical back: Neck supple.  Lymphadenopathy:     Cervical: No cervical adenopathy.  Skin:    General: Skin is warm and dry.     Findings: No rash.  Neurological:     Mental Status: He is alert and oriented to person, place, and time.     Coordination: Coordination  normal.  Psychiatric:        Behavior: Behavior normal.       Assessment & Plan:   Problem List Items Addressed This Visit    None    Visit Diagnoses    Well adult exam    -  Primary   Relevant Orders   CBC with Differential/Platelet (Completed)   CMP14+EGFR (Completed)   Lipid panel (Completed)      Patient stopped taking his cholesterol pill because he was exercising and felt good so he stopped it. Follow up plan: Return in about 1 year (around 06/01/2021), or if symptoms worsen or fail to improve, for Well exam.  Counseling provided for all of the vaccine components No orders of the defined types were placed in this encounter.   Caryl Pina, MD [facility] 06/01/2020, 2:39 PM

## 2020-06-02 LAB — CBC WITH DIFFERENTIAL/PLATELET
Basophils Absolute: 0.1 10*3/uL (ref 0.0–0.2)
Basos: 2 %
EOS (ABSOLUTE): 0.2 10*3/uL (ref 0.0–0.4)
Eos: 2 %
Hematocrit: 42.1 % (ref 37.5–51.0)
Hemoglobin: 14.3 g/dL (ref 13.0–17.7)
Immature Grans (Abs): 0 10*3/uL (ref 0.0–0.1)
Immature Granulocytes: 0 %
Lymphocytes Absolute: 2.7 10*3/uL (ref 0.7–3.1)
Lymphs: 36 %
MCH: 30 pg (ref 26.6–33.0)
MCHC: 34 g/dL (ref 31.5–35.7)
MCV: 88 fL (ref 79–97)
Monocytes Absolute: 0.7 10*3/uL (ref 0.1–0.9)
Monocytes: 9 %
Neutrophils Absolute: 3.8 10*3/uL (ref 1.4–7.0)
Neutrophils: 51 %
Platelets: 255 10*3/uL (ref 150–450)
RBC: 4.77 x10E6/uL (ref 4.14–5.80)
RDW: 13.1 % (ref 11.6–15.4)
WBC: 7.5 10*3/uL (ref 3.4–10.8)

## 2020-06-02 LAB — CMP14+EGFR
ALT: 23 IU/L (ref 0–44)
AST: 21 IU/L (ref 0–40)
Albumin/Globulin Ratio: 1.8 (ref 1.2–2.2)
Albumin: 4.7 g/dL (ref 3.8–4.9)
Alkaline Phosphatase: 73 IU/L (ref 44–121)
BUN/Creatinine Ratio: 11 (ref 9–20)
BUN: 13 mg/dL (ref 6–24)
Bilirubin Total: 0.5 mg/dL (ref 0.0–1.2)
CO2: 23 mmol/L (ref 20–29)
Calcium: 9.8 mg/dL (ref 8.7–10.2)
Chloride: 99 mmol/L (ref 96–106)
Creatinine, Ser: 1.18 mg/dL (ref 0.76–1.27)
GFR calc Af Amer: 78 mL/min/{1.73_m2} (ref >59)
GFR calc non Af Amer: 67 mL/min/{1.73_m2} (ref >59)
Globulin, Total: 2.6 g/dL (ref 1.5–4.5)
Glucose: 88 mg/dL (ref 65–99)
Potassium: 4.4 mmol/L (ref 3.5–5.2)
Sodium: 139 mmol/L (ref 134–144)
Total Protein: 7.3 g/dL (ref 6.0–8.5)

## 2020-06-02 LAB — LIPID PANEL
Chol/HDL Ratio: 5.7 ratio — ABNORMAL HIGH (ref 0.0–5.0)
Cholesterol, Total: 266 mg/dL — ABNORMAL HIGH (ref 100–199)
HDL: 47 mg/dL (ref >39)
LDL Chol Calc (NIH): 186 mg/dL — ABNORMAL HIGH (ref 0–99)
Triglycerides: 178 mg/dL — ABNORMAL HIGH (ref 0–149)
VLDL Cholesterol Cal: 33 mg/dL (ref 5–40)

## 2020-06-16 ENCOUNTER — Other Ambulatory Visit: Payer: Self-pay | Admitting: Family Medicine

## 2021-06-15 ENCOUNTER — Ambulatory Visit (INDEPENDENT_AMBULATORY_CARE_PROVIDER_SITE_OTHER): Payer: No Typology Code available for payment source | Admitting: Family Medicine

## 2021-06-15 ENCOUNTER — Other Ambulatory Visit: Payer: Self-pay

## 2021-06-15 ENCOUNTER — Encounter: Payer: Self-pay | Admitting: Family Medicine

## 2021-06-15 VITALS — BP 118/76 | HR 56 | Ht 67.0 in | Wt 167.0 lb

## 2021-06-15 DIAGNOSIS — Z125 Encounter for screening for malignant neoplasm of prostate: Secondary | ICD-10-CM

## 2021-06-15 DIAGNOSIS — Z0001 Encounter for general adult medical examination with abnormal findings: Secondary | ICD-10-CM | POA: Diagnosis not present

## 2021-06-15 DIAGNOSIS — E785 Hyperlipidemia, unspecified: Secondary | ICD-10-CM | POA: Diagnosis not present

## 2021-06-15 DIAGNOSIS — Z0000 Encounter for general adult medical examination without abnormal findings: Secondary | ICD-10-CM

## 2021-06-15 MED ORDER — ATORVASTATIN CALCIUM 20 MG PO TABS
20.0000 mg | ORAL_TABLET | Freq: Every day | ORAL | 3 refills | Status: DC
Start: 2021-06-15 — End: 2022-06-21

## 2021-06-15 NOTE — Progress Notes (Signed)
 BP 118/76   Pulse (!) 56   Ht 5' 7" (1.702 m)   Wt 167 lb (75.8 kg)   SpO2 99%   BMI 26.16 kg/m    Subjective:   Patient ID: Dylan Miller, male    DOB: 03/04/1961, 60 y.o.   MRN: [id number]  HPI: Dylan Miller is a 60 y.o. male presenting on 06/15/2021 for Medical Management of Chronic Issues (CPE) and Hyperlipidemia   HPI Adult well exam Patient denies any chest pain, shortness of breath, headaches or vision issues, abdominal complaints, diarrhea, nausea, vomiting, or joint issues.   Hyperlipidemia Patient is coming in for recheck of his hyperlipidemia. The patient is currently taking atorvastatin. They deny any issues with myalgias or history of liver damage from it. They deny any focal numbness or weakness or chest pain.   Relevant past medical, surgical, family and social history reviewed and updated as indicated. Interim medical history since our last visit reviewed. Allergies and medications reviewed and updated.  Review of Systems  Constitutional:  Negative for chills and fever.  HENT:  Negative for ear pain and tinnitus.   Eyes:  Negative for pain.  Respiratory:  Negative for cough, shortness of breath and wheezing.   Cardiovascular:  Negative for chest pain, palpitations and leg swelling.  Gastrointestinal:  Negative for abdominal pain, blood in stool, constipation and diarrhea.  Genitourinary:  Negative for dysuria and hematuria.  Musculoskeletal:  Negative for back pain, gait problem and myalgias.  Skin:  Negative for rash.  Neurological:  Negative for dizziness, weakness and headaches.  Psychiatric/Behavioral:  Negative for suicidal ideas.   All other systems reviewed and are negative.  Per HPI unless specifically indicated above   Allergies as of 06/15/2021       Reactions   Adhesive [tape] Rash   BAND-AID         Medication List        Accurate as of June 15, 2021 10:09 AM. If you have any questions, ask your nurse or doctor.           atorvastatin 20 MG tablet Commonly known as: LIPITOR Take 1 tablet (20 mg total) by mouth at bedtime.   hydrOXYzine 25 MG tablet Commonly known as: ATARAX/VISTARIL Take 1 tablet (25 mg total) by mouth every 6 (six) hours.   ibuprofen 200 MG tablet Commonly known as: ADVIL Take 200 mg by mouth daily as needed for moderate pain.   naproxen sodium 220 MG tablet Commonly known as: ALEVE Take 220 mg by mouth daily as needed (pain).         Objective:   BP 118/76   Pulse (!) 56   Ht 5' 7" (1.702 m)   Wt 167 lb (75.8 kg)   SpO2 99%   BMI 26.16 kg/m   Wt Readings from Last 3 Encounters:  06/15/21 167 lb (75.8 kg)  06/01/20 173 lb 8 oz (78.7 kg)  04/11/20 169 lb 12.1 oz (77 kg)    Physical Exam Vitals and nursing note reviewed.  Constitutional:      General: He is not in acute distress.    Appearance: He is well-developed. He is not diaphoretic.  HENT:     Right Ear: External ear normal.     Left Ear: External ear normal.     Nose: Nose normal.     Mouth/Throat:     Pharynx: No oropharyngeal exudate.  Eyes:     General: No scleral icterus.         Right eye: No discharge.     Conjunctiva/sclera: Conjunctivae normal.     Pupils: Pupils are equal, round, and reactive to light.  Neck:     Thyroid: No thyromegaly.  Cardiovascular:     Rate and Rhythm: Normal rate and regular rhythm.     Heart sounds: Normal heart sounds. No murmur heard. Pulmonary:     Effort: Pulmonary effort is normal. No respiratory distress.     Breath sounds: Normal breath sounds. No wheezing.  Abdominal:     General: Bowel sounds are normal. There is no distension.     Palpations: Abdomen is soft.     Tenderness: There is no abdominal tenderness. There is no guarding or rebound.  Musculoskeletal:        General: Normal range of motion.     Cervical back: Neck supple.  Lymphadenopathy:     Cervical: No cervical adenopathy.  Skin:    General: Skin is warm and dry.     Findings: No rash.   Neurological:     Mental Status: He is alert and oriented to person, place, and time.     Coordination: Coordination normal.  Psychiatric:        Behavior: Behavior normal.      Assessment & Plan:   Problem List Items Addressed This Visit   None Visit Diagnoses     Well adult exam    -  Primary   Relevant Orders   CBC with Differential/Platelet   CMP14+EGFR   Lipid panel   PSA, total and free   Prostate cancer screening       Relevant Orders   PSA, total and free   Hyperlipidemia, unspecified hyperlipidemia type       Relevant Medications   atorvastatin (LIPITOR) 20 MG tablet   Other Relevant Orders   Lipid panel       Continue current medicine, seems to be doing well, will check blood work.  He keeps active at his workplace and seems to be doing well with that. Follow up plan: Return in about 1 year (around 06/15/2022), or if symptoms worsen or fail to improve.  Counseling provided for all of the vaccine components Orders Placed This Encounter  Procedures   CBC with Differential/Platelet   CMP14+EGFR   Lipid panel   PSA, total and free    Caryl Pina, MD [facility] 06/15/2021, 10:09 AM

## 2021-06-16 LAB — PSA, TOTAL AND FREE
PSA, Free Pct: 16.9 %
PSA, Free: 0.22 ng/mL
Prostate Specific Ag, Serum: 1.3 ng/mL (ref 0.0–4.0)

## 2021-06-16 LAB — CBC WITH DIFFERENTIAL/PLATELET
Basophils Absolute: 0.1 10*3/uL (ref 0.0–0.2)
Basos: 1 %
EOS (ABSOLUTE): 0.1 10*3/uL (ref 0.0–0.4)
Eos: 2 %
Hematocrit: 41 % (ref 37.5–51.0)
Hemoglobin: 13.7 g/dL (ref 13.0–17.7)
Immature Grans (Abs): 0 10*3/uL (ref 0.0–0.1)
Immature Granulocytes: 0 %
Lymphocytes Absolute: 1.8 10*3/uL (ref 0.7–3.1)
Lymphs: 28 %
MCH: 29.5 pg (ref 26.6–33.0)
MCHC: 33.4 g/dL (ref 31.5–35.7)
MCV: 88 fL (ref 79–97)
Monocytes Absolute: 0.5 10*3/uL (ref 0.1–0.9)
Monocytes: 8 %
Neutrophils Absolute: 3.7 10*3/uL (ref 1.4–7.0)
Neutrophils: 61 %
Platelets: 248 10*3/uL (ref 150–450)
RBC: 4.65 x10E6/uL (ref 4.14–5.80)
RDW: 12.6 % (ref 11.6–15.4)
WBC: 6.2 10*3/uL (ref 3.4–10.8)

## 2021-06-16 LAB — CMP14+EGFR
ALT: 30 IU/L (ref 0–44)
AST: 28 IU/L (ref 0–40)
Albumin/Globulin Ratio: 2 (ref 1.2–2.2)
Albumin: 4.8 g/dL (ref 3.8–4.9)
Alkaline Phosphatase: 70 IU/L (ref 44–121)
BUN/Creatinine Ratio: 10 (ref 10–24)
BUN: 12 mg/dL (ref 8–27)
Bilirubin Total: 0.6 mg/dL (ref 0.0–1.2)
CO2: 24 mmol/L (ref 20–29)
Calcium: 10.3 mg/dL — ABNORMAL HIGH (ref 8.6–10.2)
Chloride: 104 mmol/L (ref 96–106)
Creatinine, Ser: 1.15 mg/dL (ref 0.76–1.27)
Globulin, Total: 2.4 g/dL (ref 1.5–4.5)
Glucose: 95 mg/dL (ref 70–99)
Potassium: 4.9 mmol/L (ref 3.5–5.2)
Sodium: 141 mmol/L (ref 134–144)
Total Protein: 7.2 g/dL (ref 6.0–8.5)
eGFR: 73 mL/min/{1.73_m2} (ref >59)

## 2021-06-16 LAB — LIPID PANEL
Chol/HDL Ratio: 3.6 ratio (ref 0.0–5.0)
Cholesterol, Total: 157 mg/dL (ref 100–199)
HDL: 44 mg/dL (ref >39)
LDL Chol Calc (NIH): 92 mg/dL (ref 0–99)
Triglycerides: 119 mg/dL (ref 0–149)
VLDL Cholesterol Cal: 21 mg/dL (ref 5–40)

## 2021-10-31 ENCOUNTER — Other Ambulatory Visit: Payer: Self-pay | Admitting: *Deleted

## 2021-10-31 DIAGNOSIS — E785 Hyperlipidemia, unspecified: Secondary | ICD-10-CM

## 2021-10-31 DIAGNOSIS — R030 Elevated blood-pressure reading, without diagnosis of hypertension: Secondary | ICD-10-CM

## 2022-05-09 ENCOUNTER — Ambulatory Visit (INDEPENDENT_AMBULATORY_CARE_PROVIDER_SITE_OTHER): Payer: Self-pay | Admitting: Family Medicine

## 2022-05-09 ENCOUNTER — Encounter: Payer: Self-pay | Admitting: Family Medicine

## 2022-05-09 VITALS — BP 117/72 | HR 60 | Temp 98.8°F | Ht 67.0 in | Wt 169.0 lb

## 2022-05-09 DIAGNOSIS — L231 Allergic contact dermatitis due to adhesives: Secondary | ICD-10-CM

## 2022-05-09 MED ORDER — CETIRIZINE HCL 10 MG PO TABS: 10.0000 mg | ORAL_TABLET | Freq: Every day | ORAL | 0 refills | Status: DC

## 2022-05-09 MED ORDER — FAMOTIDINE 20 MG PO TABS: 20.0000 mg | ORAL_TABLET | Freq: Two times a day (BID) | ORAL | 0 refills | Status: DC

## 2022-05-09 MED ORDER — TRIAMCINOLONE ACETONIDE 0.1 % EX CREA: 1.0000 | TOPICAL_CREAM | Freq: Two times a day (BID) | CUTANEOUS | 0 refills | Status: DC

## 2022-05-09 MED ORDER — METHYLPREDNISOLONE ACETATE 80 MG/ML IJ SUSP
60.0000 mg | Freq: Once | INTRAMUSCULAR | Status: AC
  Administered 2022-05-09: 60 mg via INTRAMUSCULAR

## 2022-05-09 NOTE — Progress Notes (Signed)
Subjective:  Patient ID: Dylan Miller, male    DOB: [birth year], 61 y.o.   MRN: [id number]  Patient Care Team: Dettinger, Fransisca Kaufmann, MD as PCP - General (Family Medicine)   Chief Complaint:  Allergic Reaction (Right pointer finger)   HPI: Dylan Miller is a 61 y.o. male presenting on 05/09/2022 for Allergic Reaction (Right pointer finger)   Allergic Reaction This is a new problem. The current episode started 5 to 7 days ago. The problem has been gradually improving since onset. The problem is moderate. Associated with: adhesive bandaid. The exposure occurred at Work. Associated symptoms include itching and a rash. Pertinent negatives include no abdominal pain, chest pain, chest pressure, coughing, diarrhea, difficulty breathing, drooling, eye itching, eye redness, eye watering, globus sensation, hyperventilation, stridor, trouble swallowing, vomiting or wheezing. Past treatments include diphenhydramine. The treatment provided mild relief.    Relevant past medical, surgical, family, and social history reviewed and updated as indicated.  Allergies and medications reviewed and updated. Data reviewed: Chart in Epic.   Past Medical History:  Diagnosis Date   Arthritis     Past Surgical History:  Procedure Laterality Date   COLONOSCOPY N/A 10/30/2017   Procedure: COLONOSCOPY;  Surgeon: Rogene Houston, MD;  Location: [facility];  Service: Endoscopy;  Laterality: N/A;  930    Social History   Socioeconomic History   Marital status: Married    Spouse name: Not on file   Number of children: Not on file   Years of education: Not on file   Highest education level: Not on file  Occupational History   Not on file  Tobacco Use   Smoking status: Former   Smokeless tobacco: Never  Vaping Use   Vaping Use: Never used  Substance and Sexual Activity   Alcohol use: Yes    Alcohol/week: 6.0 standard drinks of alcohol    Types: 6 Cans of beer per week   Drug use: No   Sexual  activity: Not on file  Other Topics Concern   Not on file  Social History Narrative   Not on file   Social Determinants of Health   Financial Resource Strain: Not on file  Food Insecurity: Not on file  Transportation Needs: Not on file  Physical Activity: Not on file  Stress: Not on file  Social Connections: Not on file  Intimate Partner Violence: Not on file    Outpatient Encounter Medications as of 05/09/2022  Medication Sig   atorvastatin (LIPITOR) 20 MG tablet Take 1 tablet (20 mg total) by mouth at bedtime.   cetirizine (ZYRTEC ALLERGY) 10 MG tablet Take 1 tablet (10 mg total) by mouth daily.   famotidine (PEPCID) 20 MG tablet Take 1 tablet (20 mg total) by mouth 2 (two) times daily for 14 days.   hydrOXYzine (ATARAX/VISTARIL) 25 MG tablet Take 1 tablet (25 mg total) by mouth every 6 (six) hours.   ibuprofen (ADVIL,MOTRIN) 200 MG tablet Take 200 mg by mouth daily as needed for moderate pain.   naproxen sodium (ALEVE) 220 MG tablet Take 220 mg by mouth daily as needed (pain).   triamcinolone cream (KENALOG) 0.1 % Apply 1 Application topically 2 (two) times daily.   No facility-administered encounter medications on file as of 05/09/2022.    Allergies  Allergen Reactions   Adhesive [Tape] Rash    BAND-AID     Review of Systems  Constitutional:  Negative for activity change, appetite change, chills, diaphoresis, fatigue, fever and unexpected weight  change.  HENT:  Negative for drooling, sore throat, trouble swallowing and voice change.   Eyes:  Negative for photophobia, pain, discharge, redness, itching and visual disturbance.  Respiratory:  Negative for apnea, cough, choking, chest tightness, shortness of breath, wheezing and stridor.   Cardiovascular:  Negative for chest pain, palpitations and leg swelling.  Gastrointestinal:  Negative for abdominal pain, diarrhea and vomiting.  Endocrine: Negative for polydipsia, polyphagia and polyuria.  Genitourinary:  Negative for  decreased urine volume and difficulty urinating.  Skin:  Positive for color change, itching and rash. Negative for pallor and wound.  Neurological:  Negative for dizziness, tremors, seizures, syncope, facial asymmetry, speech difficulty, weakness, light-headedness, numbness and headaches.  Psychiatric/Behavioral:  Negative for confusion.   All other systems reviewed and are negative.       Objective:  BP 117/72   Pulse 69   Temp 98.8 F (37.1 C)   Ht 5' 7"  (1.702 m)   Wt 169 lb (76.7 kg)   SpO2 96%   BMI 26.47 kg/m    Wt Readings from Last 3 Encounters:  05/09/22 169 lb (76.7 kg)  06/15/21 167 lb (75.8 kg)  06/01/20 173 lb 8 oz (78.7 kg)    Physical Exam Vitals and nursing note reviewed.  Constitutional:      General: He is not in acute distress.    Appearance: Normal appearance. He is not ill-appearing, toxic-appearing or diaphoretic.  HENT:     Head: Normocephalic and atraumatic.     Nose: Nose normal.  Eyes:     Pupils: Pupils are equal, round, and reactive to light.  Cardiovascular:     Rate and Rhythm: Normal rate and regular rhythm.     Heart sounds: Normal heart sounds.  Pulmonary:     Effort: Pulmonary effort is normal. No respiratory distress.     Breath sounds: Normal breath sounds. No stridor. No wheezing, rhonchi or rales.  Chest:     Chest wall: No tenderness.  Skin:    General: Skin is warm and dry.     Capillary Refill: Capillary refill takes less than 2 seconds.     Findings: Erythema and rash present. Rash is papular and vesicular.       Neurological:     General: No focal deficit present.     Mental Status: He is alert and oriented to person, place, and time.  Psychiatric:        Mood and Affect: Mood normal.        Behavior: Behavior normal.        Thought Content: Thought content normal.        Judgment: Judgment normal.     Results for orders placed or performed in visit on 06/15/21  CBC with Differential/Platelet  Result Value Ref  Range   WBC 6.2 3.4 - 10.8 x10E3/uL   RBC 4.65 4.14 - 5.80 x10E6/uL   Hemoglobin 13.7 13.0 - 17.7 g/dL   Hematocrit 41.0 37.5 - 51.0 %   MCV 88 79 - 97 fL   MCH 29.5 26.6 - 33.0 pg   MCHC 33.4 31.5 - 35.7 g/dL   RDW 12.6 11.6 - 15.4 %   Platelets 248 150 - 450 x10E3/uL   Neutrophils 61 Not Estab. %   Lymphs 28 Not Estab. %   Monocytes 8 Not Estab. %   Eos 2 Not Estab. %   Basos 1 Not Estab. %   Neutrophils Absolute 3.7 1.4 - 7.0 x10E3/uL   Lymphocytes Absolute 1.8  0.7 - 3.1 x10E3/uL   Monocytes Absolute 0.5 0.1 - 0.9 x10E3/uL   EOS (ABSOLUTE) 0.1 0.0 - 0.4 x10E3/uL   Basophils Absolute 0.1 0.0 - 0.2 x10E3/uL   Immature Granulocytes 0 Not Estab. %   Immature Grans (Abs) 0.0 0.0 - 0.1 x10E3/uL  CMP14+EGFR  Result Value Ref Range   Glucose 95 70 - 99 mg/dL   BUN 12 8 - 27 mg/dL   Creatinine, Ser 1.15 0.76 - 1.27 mg/dL   eGFR 73 >59 mL/min/1.73   BUN/Creatinine Ratio 10 10 - 24   Sodium 141 134 - 144 mmol/L   Potassium 4.9 3.5 - 5.2 mmol/L   Chloride 104 96 - 106 mmol/L   CO2 24 20 - 29 mmol/L   Calcium 10.3 (H) 8.6 - 10.2 mg/dL   Total Protein 7.2 6.0 - 8.5 g/dL   Albumin 4.8 3.8 - 4.9 g/dL   Globulin, Total 2.4 1.5 - 4.5 g/dL   Albumin/Globulin Ratio 2.0 1.2 - 2.2   Bilirubin Total 0.6 0.0 - 1.2 mg/dL   Alkaline Phosphatase 70 44 - 121 IU/L   AST 28 0 - 40 IU/L   ALT 30 0 - 44 IU/L  Lipid panel  Result Value Ref Range   Cholesterol, Total 157 100 - 199 mg/dL   Triglycerides 119 0 - 149 mg/dL   HDL 44 >39 mg/dL   VLDL Cholesterol Cal 21 5 - 40 mg/dL   LDL Chol Calc (NIH) 92 0 - 99 mg/dL   Chol/HDL Ratio 3.6 0.0 - 5.0 ratio  PSA, total and free  Result Value Ref Range   Prostate Specific Ag, Serum 1.3 0.0 - 4.0 ng/mL   PSA, Free 0.22 N/A ng/mL   PSA, Free Pct 16.9 %       Pertinent labs & imaging results that were available during my care of the patient were reviewed by me and considered in my medical decision making.  Assessment & Plan:  Maurion was seen  today for allergic reaction.  Diagnoses and all orders for this visit:  Allergic contact dermatitis due to adhesives Bursted with steroids in office. Medications as prescribed. Avoid triggers. Report new, worsening, or persistent symptoms.  -     famotidine (PEPCID) 20 MG tablet; Take 1 tablet (20 mg total) by mouth 2 (two) times daily for 14 days. -     cetirizine (ZYRTEC ALLERGY) 10 MG tablet; Take 1 tablet (10 mg total) by mouth daily. -     triamcinolone cream (KENALOG) 0.1 %; Apply 1 Application topically 2 (two) times daily.     Continue all other maintenance medications.  Follow up plan: Return if symptoms worsen or fail to improve.   Continue healthy lifestyle choices, including diet (rich in fruits, vegetables, and lean proteins, and low in salt and simple carbohydrates) and exercise (at least 30 minutes of moderate physical activity daily).  Educational handout given for contact dermatitis   The above assessment and management plan was discussed with the patient. The patient verbalized understanding of and has agreed to the management plan. Patient is aware to call the clinic if they develop any new symptoms or if symptoms persist or worsen. Patient is aware when to return to the clinic for a follow-up visit. Patient educated on when it is appropriate to go to the emergency department.   Monia Pouch, FNP-C [facility] [phone]

## 2022-05-09 NOTE — Addendum Note (Signed)
Addended by: Geryl Rankins D on: 05/09/2022 03:31 PM   Modules accepted: Orders

## 2022-06-21 ENCOUNTER — Ambulatory Visit (INDEPENDENT_AMBULATORY_CARE_PROVIDER_SITE_OTHER): Payer: PRIVATE HEALTH INSURANCE | Admitting: Family Medicine

## 2022-06-21 ENCOUNTER — Encounter: Payer: Self-pay | Admitting: Family Medicine

## 2022-06-21 VITALS — BP 136/80 | HR 62 | Temp 98.8°F | Ht 67.0 in | Wt 165.0 lb

## 2022-06-21 DIAGNOSIS — H9313 Tinnitus, bilateral: Secondary | ICD-10-CM

## 2022-06-21 DIAGNOSIS — B350 Tinea barbae and tinea capitis: Secondary | ICD-10-CM

## 2022-06-21 DIAGNOSIS — E785 Hyperlipidemia, unspecified: Secondary | ICD-10-CM | POA: Diagnosis not present

## 2022-06-21 DIAGNOSIS — Z0001 Encounter for general adult medical examination with abnormal findings: Secondary | ICD-10-CM

## 2022-06-21 DIAGNOSIS — Z0000 Encounter for general adult medical examination without abnormal findings: Secondary | ICD-10-CM

## 2022-06-21 MED ORDER — ATORVASTATIN CALCIUM 20 MG PO TABS: 20.0000 mg | ORAL_TABLET | Freq: Every day | ORAL | 3 refills | Status: DC

## 2022-06-21 NOTE — Progress Notes (Signed)
BP 136/80   Pulse 62   Temp 98.8 F (37.1 C)   Ht _0  (1.702 m)   Wt 165 lb (74.8 kg)   SpO2 97%   BMI 25.84 kg/m    Subjective:   Patient ID: Dylan Miller, male    DOB: 1961/08/06, 61 y.o.   MRN: [id number]  HPI: Daveyon Kitchings is a 61 y.o. male presenting on 06/21/2022 for Annual Exam (No concerns )  HPI  Hyperlipidemia Patient reports that life is going well overall. No concerns at this time. Patient is compliant with his medications, including atorvastatin. He does not check blood pressures at home. 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   Centrofacial erythema  Patient reports that he has had somewhat worsening rash on his face over the past year which he has been treating with over the counter steroid cream. Rash is not located elsewhere on the body. Steroids have "kept it under control."   Relevant past medical, surgical, family and social history reviewed and updated as indicated. Interim medical history since our last visit reviewed. Allergies and medications reviewed and updated.  Review of Systems  Constitutional:  Negative for activity change, appetite change, chills, diaphoresis, fatigue, fever and unexpected weight change.  HENT:  Negative for congestion, ear pain, facial swelling, sneezing and sore throat.   Respiratory:  Negative for cough, choking, chest tightness and shortness of breath.   Cardiovascular:  Negative for chest pain, palpitations and leg swelling.  Gastrointestinal:  Negative for blood in stool, constipation, diarrhea and nausea.  Endocrine: Negative for cold intolerance and heat intolerance.  Genitourinary:  Negative for difficulty urinating, dysuria, penile pain, testicular pain and urgency.  Musculoskeletal:  Negative for arthralgias and back pain.  Skin:  Negative for color change and pallor.  Neurological:  Negative for dizziness, numbness and  headaches.    Per HPI unless specifically indicated above   Allergies as of 06/21/2022       Reactions   Adhesive [tape] Rash   BAND-AID         Medication List        Accurate as of June 21, 2022  1:22 PM. If you have any questions, ask your nurse or doctor.          atorvastatin 20 MG tablet Commonly known as: LIPITOR Take 1 tablet (20 mg total) by mouth at bedtime.   cetirizine 10 MG tablet Commonly known as: ZyrTEC Allergy Take 1 tablet (10 mg total) by mouth daily.   famotidine 20 MG tablet Commonly known as: Pepcid Take 1 tablet (20 mg total) by mouth 2 (two) times daily for 14 days.   hydrOXYzine 25 MG tablet Commonly known as: ATARAX Take 1 tablet (25 mg total) by mouth every 6 (six) hours.   ibuprofen 200 MG tablet Commonly known as: ADVIL Take 200 mg by mouth daily as needed for moderate pain.   naproxen sodium 220 MG tablet Commonly known as: ALEVE Take 220 mg by mouth daily as needed (pain).   triamcinolone cream 0.1 % Commonly known as: KENALOG Apply 1 Application topically 2 (two) times daily.         Objective:   BP 136/80   Pulse 62   Temp 98.8 F (37.1 C)   Ht _1  (1.702 m)   Wt 165 lb (74.8 kg)   SpO2 97%   BMI 25.84 kg/m   Wt  Readings from Last 3 Encounters:  06/21/22 165 lb (74.8 kg)  05/09/22 169 lb (76.7 kg)  06/15/21 167 lb (75.8 kg)    Physical Examination:  General: Awake, alert, well nourished, No acute distress HEENT: Normal    Neck: No masses palpated. No lymphadenopathy    Ears: Tympanic membranes intact, normal light reflex, no erythema, no bulging    Eyes: PERRLA, extraocular membranes intact, sclera white    Nose: nasal turbinates moist, no nasal discharge    Throat: moist mucus membranes, no erythema, no tonsillar exudate.  Airway is patent Cardio: regular rate and rhythm, S1S2 heard, no murmurs appreciated Pulm: clear to auscultation bilaterally, no wheezes, rhonchi or rales; normal work of  breathing on room air GI: soft, non-tender, non-distended, bowel sounds present x4, no hepatomegaly, no splenomegaly, no masses Extremities: warm, well perfused, No edema, cyanosis or clubbing; +1 pulses bilaterally Skin: dry; intact; Erythematous plaque with minor papules and involvement of beard noted primarily across centrofacial area.     Assessment & Plan:   Problem List Items Addressed This Visit       Other   Hyperlipidemia   Relevant Medications   atorvastatin (LIPITOR) 20 MG tablet   Other Visit Diagnoses     Well adult exam    -  Primary   Relevant Orders   CBC with Differential/Platelet (Completed)   CMP14+EGFR (Completed)   Lipid panel (Completed)   TSH (Completed)   PSA, total and free (Completed)   Tinnitus of both ears       Tinea barbae          Patient's blood pressure appears to be well-controlled at this time. Will check TSH, lipid panel, CBC, and CMP. No changes to medication at this time.   Rash is of unclear etiology at this time. Physical exam shows erythematous plaque with minor papules and involvement of beard noted primarily across malar area.  Patient is not paricularly bothered by the rash. Most likely dermatophyte infection involving the beard hair. Counseled patient to try an over-the-counter antifungal cream. If no improvement, will consider aditional etiologies such as rosacea and treat with metronidozole or brimonodine.   Follow up plan: Return if symptoms worsen or fail to improve.  Counseling provided for all of the vaccine components Orders Placed This Encounter  Procedures   CBC with Differential/Platelet   CMP14+EGFR   Lipid panel   TSH   PSA, total and free    Stephani Police, MS3 06/21/2022, 1:22 PM  Patient seen and examined with medical student.  Agree with assessment and plan above.  The facial rash at this time would be concerning of possible tinea or seborrhea, will treat with over-the-counter antifungal cream and if does not  improve, consider treating for rosacea. Caryl Pina, MD [facility] 06/27/2022, 2:10 PM

## 2022-06-22 LAB — CMP14+EGFR
ALT: 27 IU/L (ref 0–44)
AST: 28 IU/L (ref 0–40)
Albumin/Globulin Ratio: 2.1 (ref 1.2–2.2)
Albumin: 4.6 g/dL (ref 3.9–4.9)
Alkaline Phosphatase: 66 IU/L (ref 44–121)
BUN/Creatinine Ratio: 10 (ref 10–24)
BUN: 12 mg/dL (ref 8–27)
Bilirubin Total: 0.6 mg/dL (ref 0.0–1.2)
CO2: 21 mmol/L (ref 20–29)
Calcium: 9.7 mg/dL (ref 8.6–10.2)
Chloride: 104 mmol/L (ref 96–106)
Creatinine, Ser: 1.16 mg/dL (ref 0.76–1.27)
Globulin, Total: 2.2 g/dL (ref 1.5–4.5)
Glucose: 100 mg/dL — ABNORMAL HIGH (ref 70–99)
Potassium: 4.9 mmol/L (ref 3.5–5.2)
Sodium: 140 mmol/L (ref 134–144)
Total Protein: 6.8 g/dL (ref 6.0–8.5)
eGFR: 72 mL/min/{1.73_m2} (ref >59)

## 2022-06-22 LAB — CBC WITH DIFFERENTIAL/PLATELET
Basophils Absolute: 0.1 10*3/uL (ref 0.0–0.2)
Basos: 1 %
EOS (ABSOLUTE): 0.1 10*3/uL (ref 0.0–0.4)
Eos: 2 %
Hematocrit: 40.4 % (ref 37.5–51.0)
Hemoglobin: 13.6 g/dL (ref 13.0–17.7)
Immature Grans (Abs): 0 10*3/uL (ref 0.0–0.1)
Immature Granulocytes: 0 %
Lymphocytes Absolute: 1.3 10*3/uL (ref 0.7–3.1)
Lymphs: 27 %
MCH: 30.7 pg (ref 26.6–33.0)
MCHC: 33.7 g/dL (ref 31.5–35.7)
MCV: 91 fL (ref 79–97)
Monocytes Absolute: 0.3 10*3/uL (ref 0.1–0.9)
Monocytes: 7 %
Neutrophils Absolute: 3 10*3/uL (ref 1.4–7.0)
Neutrophils: 63 %
Platelets: 249 10*3/uL (ref 150–450)
RBC: 4.43 x10E6/uL (ref 4.14–5.80)
RDW: 12.9 % (ref 11.6–15.4)
WBC: 4.7 10*3/uL (ref 3.4–10.8)

## 2022-06-22 LAB — LIPID PANEL
Chol/HDL Ratio: 3.2 ratio (ref 0.0–5.0)
Cholesterol, Total: 142 mg/dL (ref 100–199)
HDL: 45 mg/dL (ref >39)
LDL Chol Calc (NIH): 74 mg/dL (ref 0–99)
Triglycerides: 129 mg/dL (ref 0–149)
VLDL Cholesterol Cal: 23 mg/dL (ref 5–40)

## 2022-06-22 LAB — TSH: TSH: 1.15 u[IU]/mL (ref 0.450–4.500)

## 2022-06-22 LAB — PSA, TOTAL AND FREE
PSA, Free Pct: 15.3 %
PSA, Free: 0.29 ng/mL
Prostate Specific Ag, Serum: 1.9 ng/mL (ref 0.0–4.0)

## 2022-06-24 NOTE — Progress Notes (Signed)
Hello Renee,  Your lab result is normal and/or stable.Some minor variations that are not significant are commonly marked abnormal, but do not represent any medical problem for you.  Best regards, Giomar Gusler, M.D.

## 2022-10-03 ENCOUNTER — Encounter (INDEPENDENT_AMBULATORY_CARE_PROVIDER_SITE_OTHER): Payer: Self-pay | Admitting: *Deleted

## 2023-03-14 ENCOUNTER — Telehealth (INDEPENDENT_AMBULATORY_CARE_PROVIDER_SITE_OTHER): Payer: Self-pay | Admitting: Gastroenterology

## 2023-03-14 NOTE — Telephone Encounter (Signed)
Who is your primary care physician: Dr.Joshua Dettinger [place]   Reasons for the colonoscopy: Recall  Have you had a colonoscopy before?  Yes 2020  Do you have family history of colon cancer? Yes brother  Previous colonoscopy with polyps removed? no  Do you have a history colorectal cancer?   no  Are you diabetic? If yes, Type 1 or Type 2?    no  Do you have a prosthetic or mechanical heart valve? no  Do you have a pacemaker/defibrillator?   no  Have you had endocarditis/atrial fibrillation? no  Have you had joint replacement within the last 12 months?  no  Do you tend to be constipated or have to use laxatives? no  Do you have any history of drugs or alchohol?  no  Do you use supplemental oxygen?  no  Have you had a stroke or heart attack within the last 6 months? no  Do you take weight loss medication?  no  Do you take any blood-thinning medications such as: (aspirin, warfarin, Plavix, Aggrenox)  no  If yes we need the name, milligram, dosage and who is prescribing doctor  Current Outpatient Medications on File Prior to Visit  Medication Sig Dispense Refill   atorvastatin (LIPITOR) 20 MG tablet Take 1 tablet (20 mg total) by mouth at bedtime. 90 tablet 3   cetirizine (ZYRTEC ALLERGY) 10 MG tablet Take 1 tablet (10 mg total) by mouth daily. (Patient not taking: Reported on 03/14/2023) 30 tablet 0   famotidine (PEPCID) 20 MG tablet Take 1 tablet (20 mg total) by mouth 2 (two) times daily for 14 days. 28 tablet 0   hydrOXYzine (ATARAX/VISTARIL) 25 MG tablet Take 1 tablet (25 mg total) by mouth every 6 (six) hours. (Patient not taking: Reported on 03/14/2023) 12 tablet 0   ibuprofen (ADVIL,MOTRIN) 200 MG tablet Take 200 mg by mouth daily as needed for moderate pain. (Patient not taking: Reported on 03/14/2023)     naproxen sodium (ALEVE) 220 MG tablet Take 220 mg by mouth daily as needed (pain). (Patient not taking: Reported on 03/14/2023)     triamcinolone cream (KENALOG) 0.1 %  Apply 1 Application topically 2 (two) times daily. (Patient not taking: Reported on 03/14/2023) 30 g 0   No current facility-administered medications on file prior to visit.    Allergies  Allergen Reactions   Adhesive [Tape] Rash    BAND-AID      Pharmacy: [facility] [place]  Primary Insurance Name: [employer] number where you can be reached: [phone]

## 2023-03-20 NOTE — Telephone Encounter (Signed)
Left message to return call 

## 2023-03-21 MED ORDER — PEG 3350-KCL-NA BICARB-NACL 420 G PO SOLR: 4000.0000 mL | Freq: Once | ORAL | 0 refills | Status: AC

## 2023-03-21 NOTE — Telephone Encounter (Signed)
Pt left message returning call. Returned call to patient. Pt scheduled for 03/28/23 at 9:30am with Dr.Ahmed. Instructions sent to pt via mail.

## 2023-03-21 NOTE — Addendum Note (Signed)
Addended by: Marlowe Shores on: 03/21/2023 08:19 AM   Modules accepted: Orders

## 2023-03-28 ENCOUNTER — Encounter (HOSPITAL_COMMUNITY): Payer: Self-pay

## 2023-03-28 ENCOUNTER — Other Ambulatory Visit: Payer: Self-pay

## 2023-03-28 ENCOUNTER — Ambulatory Visit (HOSPITAL_COMMUNITY)
Admission: RE | Admit: 2023-03-28 | Discharge: 2023-03-28 | Disposition: A | Discharge status: Home / Self Care | Payer: No Typology Code available for payment source | Attending: Gastroenterology | Admitting: Gastroenterology

## 2023-03-28 ENCOUNTER — Encounter (HOSPITAL_COMMUNITY)
Admission: RE | Disposition: A | Discharge status: Home / Self Care | Payer: Self-pay | Source: Home / Self Care | Attending: Gastroenterology

## 2023-03-28 ENCOUNTER — Ambulatory Visit (HOSPITAL_BASED_OUTPATIENT_CLINIC_OR_DEPARTMENT_OTHER): Payer: No Typology Code available for payment source | Admitting: Anesthesiology

## 2023-03-28 ENCOUNTER — Ambulatory Visit (HOSPITAL_COMMUNITY): Payer: No Typology Code available for payment source | Admitting: Anesthesiology

## 2023-03-28 DIAGNOSIS — K648 Other hemorrhoids: Secondary | ICD-10-CM | POA: Diagnosis not present

## 2023-03-28 DIAGNOSIS — Z1211 Encounter for screening for malignant neoplasm of colon: Secondary | ICD-10-CM | POA: Insufficient documentation

## 2023-03-28 DIAGNOSIS — Z800 Family history of malignant neoplasm of digestive organs: Secondary | ICD-10-CM | POA: Insufficient documentation

## 2023-03-28 DIAGNOSIS — K5730 Diverticulosis of large intestine without perforation or abscess without bleeding: Secondary | ICD-10-CM | POA: Diagnosis not present

## 2023-03-28 DIAGNOSIS — M1990 Unspecified osteoarthritis, unspecified site: Secondary | ICD-10-CM | POA: Diagnosis not present

## 2023-03-28 DIAGNOSIS — Z87891 Personal history of nicotine dependence: Secondary | ICD-10-CM | POA: Insufficient documentation

## 2023-03-28 DIAGNOSIS — K644 Residual hemorrhoidal skin tags: Secondary | ICD-10-CM | POA: Diagnosis not present

## 2023-03-28 DIAGNOSIS — D128 Benign neoplasm of rectum: Secondary | ICD-10-CM | POA: Diagnosis not present

## 2023-03-28 HISTORY — DX: Disorder of lipoprotein metabolism, unspecified: E78.9

## 2023-03-28 HISTORY — PX: POLYPECTOMY: SHX5525

## 2023-03-28 HISTORY — PX: COLONOSCOPY WITH PROPOFOL: SHX5780

## 2023-03-28 SURGERY — COLONOSCOPY WITH PROPOFOL
Anesthesia: General

## 2023-03-28 MED ORDER — LIDOCAINE HCL (PF) 2 % IJ SOLN
INTRAMUSCULAR | Status: AC
  Filled 2023-03-28: qty 5

## 2023-03-28 MED ORDER — PROPOFOL 500 MG/50ML IV EMUL
INTRAVENOUS | Status: DC | PRN
  Administered 2023-03-28: 150 ug/kg/min via INTRAVENOUS

## 2023-03-28 MED ORDER — STERILE WATER FOR IRRIGATION IR SOLN
Status: DC | PRN
  Administered 2023-03-28: 60 mL

## 2023-03-28 MED ORDER — PHENYLEPHRINE 80 MCG/ML (10ML) SYRINGE FOR IV PUSH (FOR BLOOD PRESSURE SUPPORT)
PREFILLED_SYRINGE | INTRAVENOUS | Status: DC | PRN
  Administered 2023-03-28: 160 ug via INTRAVENOUS

## 2023-03-28 MED ORDER — LACTATED RINGERS IV SOLN: INTRAVENOUS | Status: DC

## 2023-03-28 MED ORDER — PROPOFOL 10 MG/ML IV BOLUS
INTRAVENOUS | Status: DC | PRN
Start: 2023-03-28 — End: 2023-03-28
  Administered 2023-03-28: 80 mg via INTRAVENOUS

## 2023-03-28 MED ORDER — LIDOCAINE HCL (CARDIAC) PF 100 MG/5ML IV SOSY
PREFILLED_SYRINGE | INTRAVENOUS | Status: DC | PRN
  Administered 2023-03-28: 80 mg via INTRAVENOUS

## 2023-03-28 MED ORDER — PROPOFOL 1000 MG/100ML IV EMUL
INTRAVENOUS | Status: AC
  Filled 2023-03-28: qty 100

## 2023-03-28 NOTE — Transfer of Care (Addendum)
Immediate Anesthesia Transfer of Care Note  Patient: Dylan Miller  Procedure(s) Performed: COLONOSCOPY WITH PROPOFOL POLYPECTOMY  Patient Location: Short Stay  Anesthesia Type:General  Level of Consciousness: drowsy and patient cooperative  Airway & Oxygen Therapy: Patient Spontanous Breathing and Patient connected to nasal cannula oxygen  Post-op Assessment: Report given to RN and Post -op Vital signs reviewed and stable  Post vital signs: Reviewed and stable  Last Vitals:  Vitals Value Taken Time  BP 109/59 03/28/23   0939  Temp 36.9 03/28/23   0939  Pulse 63 03/28/23   0939  Resp 14 03/28/23   0939  SpO2 95% 03/28/23   0939    Last Pain:  Vitals:   03/28/23 0900  TempSrc:   PainSc: 0-No pain      Patients Stated Pain Goal: 5 (03/28/23 0807)  Complications: No notable events documented.

## 2023-03-28 NOTE — Anesthesia Postprocedure Evaluation (Signed)
Anesthesia Post Note  Patient: Dylan Miller  Procedure(s) Performed: COLONOSCOPY WITH PROPOFOL POLYPECTOMY  Patient location during evaluation: Phase II Anesthesia Type: General Level of consciousness: awake and alert and oriented Pain management: pain level controlled Vital Signs Assessment: post-procedure vital signs reviewed and stable Respiratory status: spontaneous breathing, nonlabored ventilation and respiratory function stable Cardiovascular status: blood pressure returned to baseline and stable Postop Assessment: no apparent nausea or vomiting Anesthetic complications: no  No notable events documented.   Last Vitals:  Vitals:   03/28/23 0818 03/28/23 0939  BP: 128/78 (!) 109/59  Pulse: 63 63  Resp: 19 14  Temp: 37 C 36.9 C  SpO2: 97% 95%    Last Pain:  Vitals:   03/28/23 0939  TempSrc: Oral  PainSc: 0-No pain                 Gabreal Worton C Damek Ende

## 2023-03-28 NOTE — Op Note (Signed)
[facility] Patient Name: Dylan Miller Procedure Date: 03/28/2023 8:54 AM MRN: [id number] Date of Birth: 08-14-1960 Attending MD: Sanjuan Dame , MD, [id number] CSN: [id number] Age: 62 Admit Type: Outpatient Procedure:                Colonoscopy Indications:              Screening in patient at increased risk: Family                            history of 1st-degree relative with colorectal                            cancer before age 76 years Providers:                Sanjuan Dame, MD, Angelica Ran, Zena Amos Referring MD:              Medicines:                Monitored Anesthesia Care Complications:            No immediate complications. Estimated Blood Loss:     Estimated blood loss: none. Procedure:                Pre-Anesthesia Assessment:                           - Prior to the procedure, a History and Physical                            was performed, and patient medications and                            allergies were reviewed. The patient's tolerance of                            previous anesthesia was also reviewed. The risks                            and benefits of the procedure and the sedation                            options and risks were discussed with the patient.                            All questions were answered, and informed consent                            was obtained. Prior Anticoagulants: The patient has                            taken no anticoagulant or antiplatelet agents. ASA                            Grade Assessment: II - A patient with mild systemic  disease. After reviewing the risks and benefits,                            the patient was deemed in satisfactory condition to                            undergo the procedure.                           After obtaining informed consent, the colonoscope                            was passed under direct vision. Throughout the                             procedure, the patient's blood pressure, pulse, and                            oxygen saturations were monitored continuously. The                            [id number]) scope was introduced through the                            anus and advanced to the the cecum, identified by                            appendiceal orifice and ileocecal valve. The                            colonoscopy was performed without difficulty. The                            patient tolerated the procedure well. The quality                            of the bowel preparation was evaluated using the                            BBPS [facility] Bowel Preparation Scale) with scores                            of: Right Colon = 3, Transverse Colon = 3 and Left                            Colon = 3 (entire mucosa seen well with no residual                            staining, small fragments of stool or opaque                            liquid). The total BBPS score equals 9. The  ileocecal valve, appendiceal orifice, and rectum                            were photographed. Scope In: 9:06:46 AM Scope Out: 9:34:53 AM Scope Withdrawal Time: 0 hours 24 minutes 33 seconds  Total Procedure Duration: 0 hours 28 minutes 7 seconds  Findings:      A 6 mm polyp was found in the rectum. The polyp was sessile. The polyp       was removed with a cold snare. Resection and retrieval were complete.      A few small-mouthed diverticula were found in the right colon.      Non-bleeding external and internal hemorrhoids were found during       retroflexion. Impression:               - One 6 mm polyp in the rectum, removed with a cold                            snare. Resected and retrieved.                           - Diverticulosis in the right colon.                           - Non-bleeding external and internal hemorrhoids. Moderate Sedation:      Per Anesthesia Care Recommendation:           - Patient  has a contact number available for                            emergencies. The signs and symptoms of potential                            delayed complications were discussed with the                            patient. Return to normal activities tomorrow.                            Written discharge instructions were provided to the                            patient.                           - Resume previous diet.                           - Continue present medications.                           - Await pathology results.                           - Repeat colonoscopy in 5 years for surveillance  given family history of premature CRC.                           - Return to primary care physician as previously                            scheduled. Procedure Code(s):        --- Professional ---                           [phone], Colonoscopy, flexible; with removal of                            tumor(s), polyp(s), or other lesion(s) by snare                            technique Diagnosis Code(s):        --- Professional ---                           Z80.0, Family history of malignant neoplasm of                            digestive organs                           D12.8, Benign neoplasm of rectum                           K64.8, Other hemorrhoids                           K57.30, Diverticulosis of large intestine without                            perforation or abscess without bleeding CPT copyright 2022 American Medical Association. All rights reserved. The codes documented in this report are preliminary and upon coder review may  be revised to meet current compliance requirements. Sanjuan Dame, MD Sanjuan Dame, MD 03/28/2023 9:38:40 AM This report has been signed electronically. Number of Addenda: 0

## 2023-03-28 NOTE — H&P (Signed)
Primary Care Physician:  Dettinger, Elige Radon, MD Primary Gastroenterologist:  Dr. Tasia Catchings  Pre-Procedure History & Physical: HPI:  Dylan Miller is a 62 y.o. male is here for a colonoscopy for colon cancer screening purposes.  No melena or hematochezia.  No abdominal pain or unintentional weight loss.  No change in bowel habits.  Overall feels well from a GI standpoint.  Brother died of CRC at age 65 Patient with 2019 normal colonoscopy  Past Medical History:  Diagnosis Date   Arthritis    Borderline high cholesterol     Past Surgical History:  Procedure Laterality Date   COLONOSCOPY N/A 10/30/2017   Procedure: COLONOSCOPY;  Surgeon: Malissa Hippo, MD;  Location: [facility];  Service: Endoscopy;  Laterality: N/A;  930    Prior to Admission medications   Medication Sig Start Date End Date Taking? Authorizing Provider  atorvastatin (LIPITOR) 20 MG tablet Take 1 tablet (20 mg total) by mouth at bedtime. 06/21/22  Yes Dettinger, Elige Radon, MD  famotidine (PEPCID) 20 MG tablet Take 1 tablet (20 mg total) by mouth 2 (two) times daily for 14 days. Patient taking differently: Take 20 mg by mouth once as needed. 05/09/22 03/28/23 Yes Rakes, Doralee Albino, FNP  triamcinolone cream (KENALOG) 0.1 % Apply 1 Application topically 2 (two) times daily. 05/09/22  Yes Rakes, Doralee Albino, FNP  cetirizine (ZYRTEC ALLERGY) 10 MG tablet Take 1 tablet (10 mg total) by mouth daily. Patient not taking: Reported on 03/14/2023 05/09/22   Sonny Masters, FNP  hydrOXYzine (ATARAX/VISTARIL) 25 MG tablet Take 1 tablet (25 mg total) by mouth every 6 (six) hours. Patient not taking: Reported on 03/14/2023 04/11/20   Wurst, [place], PA-C  ibuprofen (ADVIL,MOTRIN) 200 MG tablet Take 200 mg by mouth daily as needed for moderate pain. Patient not taking: Reported on 03/14/2023    [provider]  naproxen sodium (ALEVE) 220 MG tablet Take 220 mg by mouth daily as needed (pain). Patient not taking: Reported on 03/14/2023     [provider]    Allergies as of 03/21/2023 - Review Complete 06/21/2022  Allergen Reaction Noted   Adhesive [tape] Rash 04/11/2020    Family History  Problem Relation Age of Onset   Hypertension Mother    Stroke Mother    Cancer Father        esophgeal cancer   Cancer Daughter        colon cancer    Social History   Socioeconomic History   Marital status: Married    Spouse name: Not on file   Number of children: Not on file   Years of education: Not on file   Highest education level: Not on file  Occupational History   Not on file  Tobacco Use   Smoking status: Former   Smokeless tobacco: Never  Vaping Use   Vaping status: Never Used  Substance and Sexual Activity   Alcohol use: Yes    Alcohol/week: 6.0 standard drinks of alcohol    Types: 6 Cans of beer per week   Drug use: No   Sexual activity: Not on file  Other Topics Concern   Not on file  Social History Narrative   Not on file   Social Determinants of Health   Financial Resource Strain: Not on file  Food Insecurity: Not on file  Transportation Needs: Not on file  Physical Activity: Not on file  Stress: Not on file  Social Connections: Not on file  Intimate Partner Violence:  Not on file    Review of Systems: See HPI, otherwise negative ROS  Physical Exam: Vital signs in last 24 hours: Temp:  [98.6 F (37 C)] 98.6 F (37 C) (08/15 0818) Pulse Rate:  [63] 63 (08/15 0818) Resp:  [19] 19 (08/15 0818) BP: (128)/(78) 128/78 (08/15 0818) SpO2:  [97 %] 97 % (08/15 0818) Weight:  [77.1 kg] 77.1 kg (08/15 0807)   General:   Alert,  Well-developed, well-nourished, pleasant and cooperative in NAD Head:  Normocephalic and atraumatic. Eyes:  Sclera clear, no icterus.   Conjunctiva pink. Ears:  Normal auditory acuity. Nose:  No deformity, discharge,  or lesions. Msk:  Symmetrical without gross deformities. Normal posture. Extremities:  Without clubbing or edema. Neurologic:  Alert and   oriented x4;  grossly normal neurologically. Skin:  Intact without significant lesions or rashes. Psych:  Alert and cooperative. Normal mood and affect.  Impression/Plan: Dylan Miller is here for a colonoscopy to be performed for colon cancer screening purposes.  The risks of the procedure including infection, bleed, or perforation as well as benefits, limitations, alternatives and imponderables have been reviewed with the patient. Questions have been answered. All parties agreeable.

## 2023-03-28 NOTE — Anesthesia Preprocedure Evaluation (Addendum)
Anesthesia Evaluation  Patient identified by MRN, date of birth, ID band Patient awake    Reviewed: Allergy & Precautions, H&P , NPO status , Patient's Chart, lab work & pertinent test results  Airway Mallampati: II  TM Distance: >3 FB Neck ROM: Full    Dental  (+) Dental Advisory Given, Missing Crown :   Pulmonary former smoker   Pulmonary exam normal breath sounds clear to auscultation       Cardiovascular negative cardio ROS Normal cardiovascular exam Rhythm:Regular Rate:Normal     Neuro/Psych negative neurological ROS  negative psych ROS   GI/Hepatic negative GI ROS, Neg liver ROS,,,  Endo/Other  negative endocrine ROS    Renal/GU negative Renal ROS  negative genitourinary   Musculoskeletal  (+) Arthritis ,    Abdominal   Peds negative pediatric ROS (+)  Hematology negative hematology ROS (+)   Anesthesia Other Findings   Reproductive/Obstetrics negative OB ROS                             Anesthesia Physical Anesthesia Plan  ASA: 2  Anesthesia Plan: General   Post-op Pain Management: Minimal or no pain anticipated   Induction: Intravenous  PONV Risk Score and Plan: 1 and Propofol infusion  Airway Management Planned: Nasal Cannula and Natural Airway  Additional Equipment:   Intra-op Plan:   Post-operative Plan:   Informed Consent: I have reviewed the patients History and Physical, chart, labs and discussed the procedure including the risks, benefits and alternatives for the proposed anesthesia with the patient or authorized representative who has indicated his/her understanding and acceptance.     Dental advisory given  Plan Discussed with: CRNA and Surgeon  Anesthesia Plan Comments:        Anesthesia Quick Evaluation

## 2023-03-28 NOTE — Discharge Instructions (Signed)

## 2023-03-29 LAB — SURGICAL PATHOLOGY

## 2023-03-29 NOTE — Progress Notes (Signed)
I reviewed the pathology results. Ann, can you send her a letter with the findings as described below please? Repeat colonoscopy in 5 years  Thanks,  Vista Lawman, MD Gastroenterology and Hepatology [facility]  ---------------------------------------------------------------------------------------------  [facility] [address], [address], [place], [place] [address] Phone:  [phone]   03/29/23 [place], [place]   Dear Ciro Backer,  I am writing to inform you that the biopsies taken during your recent endoscopic examination showed:  Tubular Adenoma   I am writing to let you know the results of your recent colonoscopy.  You had a total of 1 polyps removed. The pathology came back as "tubular adenoma." These findings are NOT cancer, but had the polyps remained in your colon, they could have turned into cancer.  Given these findings, it is recommended that your next colonoscopy be performed in 5 years given history of colon cancer in family .   Please call us at [phone] if you have persistent problems or have questions about your condition that have not been fully answered at this time.  Sincerely,  Vista Lawman, MD Gastroenterology and Hepatology

## 2023-04-01 ENCOUNTER — Encounter (INDEPENDENT_AMBULATORY_CARE_PROVIDER_SITE_OTHER): Payer: Self-pay | Admitting: *Deleted

## 2023-04-02 ENCOUNTER — Encounter (HOSPITAL_COMMUNITY): Payer: Self-pay | Admitting: Gastroenterology

## 2023-06-28 ENCOUNTER — Encounter: Payer: Self-pay | Admitting: Family Medicine

## 2023-06-28 ENCOUNTER — Ambulatory Visit (INDEPENDENT_AMBULATORY_CARE_PROVIDER_SITE_OTHER): Payer: 59 | Admitting: Family Medicine

## 2023-06-28 VITALS — BP 124/76 | HR 67 | Ht 67.0 in | Wt 171.0 lb

## 2023-06-28 DIAGNOSIS — Z0001 Encounter for general adult medical examination with abnormal findings: Secondary | ICD-10-CM

## 2023-06-28 DIAGNOSIS — R030 Elevated blood-pressure reading, without diagnosis of hypertension: Secondary | ICD-10-CM | POA: Diagnosis not present

## 2023-06-28 DIAGNOSIS — Z0000 Encounter for general adult medical examination without abnormal findings: Secondary | ICD-10-CM

## 2023-06-28 DIAGNOSIS — Z125 Encounter for screening for malignant neoplasm of prostate: Secondary | ICD-10-CM | POA: Diagnosis not present

## 2023-06-28 DIAGNOSIS — E785 Hyperlipidemia, unspecified: Secondary | ICD-10-CM | POA: Diagnosis not present

## 2023-06-28 MED ORDER — ATORVASTATIN CALCIUM 20 MG PO TABS
20.0000 mg | ORAL_TABLET | Freq: Every day | ORAL | 3 refills | Status: DC
Start: 2023-06-28 — End: 2024-07-01

## 2023-06-28 NOTE — Progress Notes (Signed)
BP 124/76   Pulse 67   Ht 5\' 7"  (1.702 m)   Wt 171 lb (77.6 kg)   SpO2 99%   BMI 26.78 kg/m    Subjective:   Patient ID: Dylan Miller, male    DOB: November 04, 1960, 62 y.o.   MRN: [id number]  HPI: Dylan Miller is a 62 y.o. male presenting on 06/28/2023 for Medical Management of Chronic Issues (CPE)   HPI Physical exam Patient denies any chest pain, shortness of breath, headaches or vision issues, abdominal complaints, diarrhea, nausea, vomiting, or joint issues.   Hyperlipidemia Patient is coming in for recheck of his hyperlipidemia. The patient is currently taking atorvastatin. They deny any issues with myalgias or history of liver damage from it. They deny any focal numbness or weakness or chest pain.   Relevant past medical, surgical, family and social history reviewed and updated as indicated. Interim medical history since our last visit reviewed. Allergies and medications reviewed and updated.  Review of Systems  Constitutional:  Negative for chills and fever.  HENT:  Negative for ear pain and tinnitus.   Eyes:  Negative for pain and visual disturbance.  Respiratory:  Negative for cough, shortness of breath and wheezing.   Cardiovascular:  Negative for chest pain, palpitations and leg swelling.  Gastrointestinal:  Negative for abdominal pain, blood in stool, constipation and diarrhea.  Genitourinary:  Negative for dysuria and hematuria.  Musculoskeletal:  Negative for back pain, gait problem and myalgias.  Skin:  Negative for rash.  Neurological:  Negative for dizziness, weakness, light-headedness and headaches.  Psychiatric/Behavioral:  Negative for suicidal ideas.   All other systems reviewed and are negative.   Per HPI unless specifically indicated above   Allergies as of 06/28/2023       Reactions   Adhesive [tape] Rash   BAND-AID         Medication List        Accurate as of June 28, 2023  2:08 PM. If you have any questions, ask your nurse or  doctor.          STOP taking these medications    cetirizine 10 MG tablet Commonly known as: ZyrTEC Allergy Stopped by: Elige Radon Benford Asch   hydrOXYzine 25 MG tablet Commonly known as: ATARAX Stopped by: Elige Radon Malaki Koury   triamcinolone cream 0.1 % Commonly known as: KENALOG Stopped by: Elige Radon Burech Mcfarland       TAKE these medications    atorvastatin 20 MG tablet Commonly known as: LIPITOR Take 1 tablet (20 mg total) by mouth at bedtime.   famotidine 20 MG tablet Commonly known as: Pepcid Take 1 tablet (20 mg total) by mouth 2 (two) times daily for 14 days. What changed:  when to take this reasons to take this   ibuprofen 200 MG tablet Commonly known as: ADVIL Take 200 mg by mouth daily as needed for moderate pain (pain score 4-6).   naproxen sodium 220 MG tablet Commonly known as: ALEVE Take 220 mg by mouth daily as needed (pain).         Objective:   BP 124/76   Pulse 67   Ht 5\' 7"  (1.702 m)   Wt 171 lb (77.6 kg)   SpO2 99%   BMI 26.78 kg/m   Wt Readings from Last 3 Encounters:  06/28/23 171 lb (77.6 kg)  03/28/23 170 lb (77.1 kg)  06/21/22 165 lb (74.8 kg)    Physical Exam Vitals reviewed.  Constitutional:  General: He is not in acute distress.    Appearance: He is well-developed. He is not diaphoretic.  HENT:     Right Ear: External ear normal.     Left Ear: External ear normal.     Nose: Nose normal.     Mouth/Throat:     Pharynx: No oropharyngeal exudate.  Eyes:     General: No scleral icterus.    Conjunctiva/sclera: Conjunctivae normal.  Neck:     Thyroid: No thyromegaly.  Cardiovascular:     Rate and Rhythm: Normal rate and regular rhythm.     Heart sounds: Normal heart sounds. No murmur heard. Pulmonary:     Effort: Pulmonary effort is normal. No respiratory distress.     Breath sounds: Normal breath sounds. No wheezing.  Abdominal:     General: Bowel sounds are normal. There is no distension.     Palpations: Abdomen  is soft.     Tenderness: There is no abdominal tenderness. There is no guarding or rebound.  Musculoskeletal:        General: No swelling. Normal range of motion.     Cervical back: Neck supple.  Lymphadenopathy:     Cervical: No cervical adenopathy.  Skin:    General: Skin is warm and dry.     Findings: No rash.  Neurological:     Mental Status: He is alert and oriented to person, place, and time.     Coordination: Coordination normal.  Psychiatric:        Behavior: Behavior normal.       Assessment & Plan:   Problem List Items Addressed This Visit       Other   Hyperlipidemia   Relevant Medications   atorvastatin (LIPITOR) 20 MG tablet   Other Relevant Orders   Lipid panel   Elevated blood pressure reading without diagnosis of hypertension   Relevant Orders   CBC with Differential/Platelet   CMP14+EGFR   Other Visit Diagnoses     Physical exam    -  Primary   Prostate cancer screening       Relevant Orders   PSA, total and free     Seems to be doing well, no changes, will check blood work today.  Follow up plan: Return in about 1 year (around 06/27/2024), or if symptoms worsen or fail to improve, for Physical exam.  Counseling provided for all of the vaccine components Orders Placed This Encounter  Procedures   CBC with Differential/Platelet   CMP14+EGFR   Lipid panel   PSA, total and free    Arville Care, MD [facility] 06/28/2023, 2:08 PM

## 2023-06-29 LAB — CBC WITH DIFFERENTIAL/PLATELET
Basophils Absolute: 0.1 10*3/uL (ref 0.0–0.2)
Basos: 1 %
EOS (ABSOLUTE): 0.1 10*3/uL (ref 0.0–0.4)
Eos: 2 %
Hematocrit: 40.2 % (ref 37.5–51.0)
Hemoglobin: 13.1 g/dL (ref 13.0–17.7)
Immature Grans (Abs): 0 10*3/uL (ref 0.0–0.1)
Immature Granulocytes: 0 %
Lymphocytes Absolute: 2.6 10*3/uL (ref 0.7–3.1)
Lymphs: 34 %
MCH: 29.6 pg (ref 26.6–33.0)
MCHC: 32.6 g/dL (ref 31.5–35.7)
MCV: 91 fL (ref 79–97)
Monocytes Absolute: 0.7 10*3/uL (ref 0.1–0.9)
Monocytes: 10 %
Neutrophils Absolute: 4 10*3/uL (ref 1.4–7.0)
Neutrophils: 53 %
Platelets: 260 10*3/uL (ref 150–450)
RBC: 4.43 x10E6/uL (ref 4.14–5.80)
RDW: 12.8 % (ref 11.6–15.4)
WBC: 7.5 10*3/uL (ref 3.4–10.8)

## 2023-06-29 LAB — CMP14+EGFR
ALT: 33 [IU]/L (ref 0–44)
AST: 29 [IU]/L (ref 0–40)
Albumin: 4.6 g/dL (ref 3.9–4.9)
Alkaline Phosphatase: 75 [IU]/L (ref 44–121)
BUN/Creatinine Ratio: 13 (ref 10–24)
BUN: 15 mg/dL (ref 8–27)
Bilirubin Total: 0.5 mg/dL (ref 0.0–1.2)
CO2: 22 mmol/L (ref 20–29)
Calcium: 9.4 mg/dL (ref 8.6–10.2)
Chloride: 104 mmol/L (ref 96–106)
Creatinine, Ser: 1.14 mg/dL (ref 0.76–1.27)
Globulin, Total: 2.3 g/dL (ref 1.5–4.5)
Glucose: 78 mg/dL (ref 70–99)
Potassium: 4.2 mmol/L (ref 3.5–5.2)
Sodium: 140 mmol/L (ref 134–144)
Total Protein: 6.9 g/dL (ref 6.0–8.5)
eGFR: 73 mL/min/{1.73_m2} (ref >59)

## 2023-06-29 LAB — LIPID PANEL
Chol/HDL Ratio: 3.4 ratio (ref 0.0–5.0)
Cholesterol, Total: 145 mg/dL (ref 100–199)
HDL: 43 mg/dL (ref >39)
LDL Chol Calc (NIH): 78 mg/dL (ref 0–99)
Triglycerides: 133 mg/dL (ref 0–149)
VLDL Cholesterol Cal: 24 mg/dL (ref 5–40)

## 2023-06-29 LAB — PSA, TOTAL AND FREE
PSA, Free Pct: 19.4 %
PSA, Free: 0.31 ng/mL
Prostate Specific Ag, Serum: 1.6 ng/mL (ref 0.0–4.0)

## 2024-01-15 ENCOUNTER — Ambulatory Visit: Payer: Self-pay

## 2024-01-15 NOTE — Telephone Encounter (Signed)
  FYI Only or Action Required?: FYI only for provider  Patient was last seen in primary care on 06/28/2023 by Dettinger, Lucio Sabin, MD. Called Nurse Triage reporting Urticaria. Symptoms began several days ago. Interventions attempted: OTC medications: oral benadryl, cortisone cream. Symptoms are: gradually worsening.  Triage Disposition: See Physician Within 24 Hours  Appointment scheduled  Patient/caregiver understands and will follow disposition?: Yes       Copied from CRM [id number]. Topic: Clinical - Red Word Triage >> Jan 15, 2024  2:15 PM Elle L wrote: Red Word that prompted transfer to Nurse Triage: The patient had an allergic reaction from the adhesive in a band-aid and both of his feet are covered in hives. Reason for Disposition  [1] MODERATE-SEVERE hives persist (i.e., hives interfere with normal activities or work) AND [2] taking antihistamine (e.g., Benadryl, Claritin) > 24 hours  Answer Assessment - Initial Assessment Questions 1. APPEARANCE: "What does the rash look like?"      Bubbled up rash on feet, looks like blisters, red, and hives 2. LOCATION: "Where is the rash located?"      Bilateral feet 3. NUMBER: "How many blisters are there?"      3-4 blisters on each foot, also hives and redness 4. SIZE: "How big are the hives?" (inches, cm, compare to coins) "Do they all look the same or is there lots of variation in shape and size?"      Nickel sized blisters 5. ONSET: "When did the hives begin?" (Hours or days ago)      Three days ago rash, has developed into blisters 6. ITCHING: "Does it itch?" If Yes, ask: "How bad is the itch?"    - MILD: doesn't interfere with normal activities   - MODERATE-SEVERE: interferes with work, school, sleep, or other activities      Itchy, moderate 7. RECURRENT PROBLEM: "Have you had hives before?" If Yes, ask: "When was the last time?" and "What happened that time?"      Yes, 2 years ago 8. TRIGGERS: "Were you exposed to any new food,  plant, cosmetic product or animal just before the hives began?"     Wife was wearing bandaid, maybe the adhesive go on to bedsheet 9. OTHER SYMPTOMS: "Do you have any other symptoms?" (e.g., fever, tongue swelling, difficulty breathing, abdomen pain)     no  Protocols used: Hives-A-AH

## 2024-01-16 ENCOUNTER — Telehealth: Payer: Self-pay

## 2024-01-16 ENCOUNTER — Encounter: Payer: Self-pay | Admitting: Family Medicine

## 2024-01-16 ENCOUNTER — Ambulatory Visit: Admitting: Family Medicine

## 2024-01-16 VITALS — BP 113/87 | HR 71 | Temp 98.0°F | Ht 67.0 in | Wt 169.0 lb

## 2024-01-16 DIAGNOSIS — L2389 Allergic contact dermatitis due to other agents: Secondary | ICD-10-CM

## 2024-01-16 DIAGNOSIS — L989 Disorder of the skin and subcutaneous tissue, unspecified: Secondary | ICD-10-CM | POA: Diagnosis not present

## 2024-01-16 MED ORDER — METHYLPREDNISOLONE ACETATE 40 MG/ML IJ SUSP
60.0000 mg | Freq: Once | INTRAMUSCULAR | Status: AC
Start: 2024-01-16 — End: 2024-01-16
  Administered 2024-01-16: 60 mg via INTRAMUSCULAR

## 2024-01-16 MED ORDER — SULFAMETHOXAZOLE-TRIMETHOPRIM 800-160 MG PO TABS
1.0000 | ORAL_TABLET | Freq: Two times a day (BID) | ORAL | 0 refills | Status: DC
Start: 2024-01-16 — End: 2024-07-01

## 2024-01-16 MED ORDER — TRIAMCINOLONE ACETONIDE 0.025 % EX OINT: 1.0000 | TOPICAL_OINTMENT | Freq: Two times a day (BID) | CUTANEOUS | 0 refills | Status: DC

## 2024-01-16 MED ORDER — FAMOTIDINE 20 MG PO TABS
20.0000 mg | ORAL_TABLET | Freq: Two times a day (BID) | ORAL | 0 refills | Status: AC
Start: 2024-01-16 — End: 2024-07-01

## 2024-01-16 NOTE — Progress Notes (Addendum)
 Subjective:  Patient ID: Dylan Miller, male    DOB: [birth year], 63 y.o.   MRN: [id number]  Patient Care Team: Dettinger, Lucio Sabin, MD as PCP - General (Family Medicine)   Chief Complaint:  blisters/hives bilateral feet   HPI: Dylan Miller is a 63 y.o. male presenting on 01/16/2024 for blisters/hives bilateral feet  HPI States that on Sunday he noticed rash, he was wearing new flip flops that he first wore on saturday. On Monday morning he tried steroid cream, betamethasone , zyrtec  and famotadine which helped some. States bilateral feet started blistering and itching. He works 12 hour shifts and wears steel toe shoes for work. States that he has not been around poison ivy. Reports that he is extremely allergic to adhesives and his wife was wearing a bandage. Reports pain while wearing shoes. Taking aleve for pain.   Relevant past medical, surgical, family, and social history reviewed and updated as indicated.  Allergies and medications reviewed and updated. Data reviewed: Chart in Epic.   Past Medical History:  Diagnosis Date   Arthritis    Borderline high cholesterol     Past Surgical History:  Procedure Laterality Date   COLONOSCOPY N/A 10/30/2017   Procedure: COLONOSCOPY;  Surgeon: Ruby Corporal, MD;  Location: [facility];  Service: Endoscopy;  Laterality: N/A;  930   COLONOSCOPY WITH PROPOFOL  N/A 03/28/2023   Procedure: COLONOSCOPY WITH PROPOFOL ;  Surgeon: Hargis Lias, MD;  Location: [facility];  Service: Endoscopy;  Laterality: N/A;  9:30AM;ASA 2   POLYPECTOMY  03/28/2023   Procedure: POLYPECTOMY;  Surgeon: Hargis Lias, MD;  Location: [facility];  Service: Endoscopy;;    Social History   Socioeconomic History   Marital status: Married    Spouse name: Not on file   Number of children: Not on file   Years of education: Not on file   Highest education level: Not on file  Occupational History   Not on file  Tobacco Use   Smoking status:  Former   Smokeless tobacco: Never  Vaping Use   Vaping status: Never Used  Substance and Sexual Activity   Alcohol use: Yes    Alcohol/week: 6.0 standard drinks of alcohol    Types: 6 Cans of beer per week   Drug use: No   Sexual activity: Not on file  Other Topics Concern   Not on file  Social History Narrative   Not on file   Social Drivers of Health   Financial Resource Strain: Not on file  Food Insecurity: Not on file  Transportation Needs: Not on file  Physical Activity: Not on file  Stress: Not on file  Social Connections: Not on file  Intimate Partner Violence: Not on file    Outpatient Encounter Medications as of 01/16/2024  Medication Sig   atorvastatin  (LIPITOR) 20 MG tablet Take 1 tablet (20 mg total) by mouth at bedtime.   ibuprofen (ADVIL,MOTRIN) 200 MG tablet Take 200 mg by mouth daily as needed for moderate pain (pain score 4-6).   naproxen sodium (ALEVE) 220 MG tablet Take 220 mg by mouth daily as needed (pain).   famotidine  (PEPCID ) 20 MG tablet Take 1 tablet (20 mg total) by mouth 2 (two) times daily for 14 days. (Patient taking differently: Take 20 mg by mouth once as needed.)   No facility-administered encounter medications on file as of 01/16/2024.    Allergies  Allergen Reactions   Adhesive [Tape] Rash    BAND-AID  Review of Systems As per HPI  Objective:  BP 113/87   Pulse 71   Temp 98 F (36.7 C)   Ht 5\' 7"  (1.702 m)   Wt 169 lb (76.7 kg)   SpO2 98%   BMI 26.47 kg/m    Wt Readings from Last 3 Encounters:  01/16/24 169 lb (76.7 kg)  06/28/23 171 lb (77.6 kg)  03/28/23 170 lb (77.1 kg)   Physical Exam Constitutional:      General: He is awake. He is not in acute distress.    Appearance: Normal appearance. He is well-developed and well-groomed. He is not ill-appearing, toxic-appearing or diaphoretic.  Cardiovascular:     Rate and Rhythm: Normal rate and regular rhythm.     Heart sounds: Normal heart sounds. No murmur heard.    No  gallop.  Pulmonary:     Effort: Pulmonary effort is normal. No respiratory distress.     Breath sounds: Normal breath sounds. No stridor. No wheezing, rhonchi or rales.  Musculoskeletal:     Cervical back: Full passive range of motion without pain and neck supple.  Skin:    General: Skin is warm.     Capillary Refill: Capillary refill takes less than 2 seconds.     Comments: Erythematous, petechial, rash with bullous lesions on bilateral ventral aspect of feet   Neurological:     General: No focal deficit present.     Mental Status: He is alert, oriented to person, place, and time and easily aroused. Mental status is at baseline.     GCS: GCS eye subscore is 4. GCS verbal subscore is 5. GCS motor subscore is 6.     Motor: No weakness.  Psychiatric:        Attention and Perception: Attention and perception normal.        Mood and Affect: Mood and affect normal.        Speech: Speech normal.        Behavior: Behavior normal. Behavior is cooperative.        Thought Content: Thought content normal. Thought content does not include homicidal or suicidal ideation. Thought content does not include homicidal or suicidal plan.        Cognition and Memory: Cognition and memory normal.        Judgment: Judgment normal.        Results for orders placed or performed in visit on 06/28/23  CBC with Differential/Platelet   Collection Time: 06/28/23  2:18 PM  Result Value Ref Range   WBC 7.5 3.4 - 10.8 x10E3/uL   RBC 4.43 4.14 - 5.80 x10E6/uL   Hemoglobin 13.1 13.0 - 17.7 g/dL   Hematocrit 16.1 09.6 - 51.0 %   MCV 91 79 - 97 fL   MCH 29.6 26.6 - 33.0 pg   MCHC 32.6 31.5 - 35.7 g/dL   RDW 04.5 40.9 - 81.1 %   Platelets 260 150 - 450 x10E3/uL   Neutrophils 53 Not Estab. %   Lymphs 34 Not Estab. %   Monocytes 10 Not Estab. %   Eos 2 Not Estab. %   Basos 1 Not Estab. %   Neutrophils Absolute 4.0 1.4 - 7.0 x10E3/uL   Lymphocytes Absolute 2.6 0.7 - 3.1 x10E3/uL   Monocytes Absolute 0.7 0.1  - 0.9 x10E3/uL   EOS (ABSOLUTE) 0.1 0.0 - 0.4 x10E3/uL   Basophils Absolute 0.1 0.0 - 0.2 x10E3/uL   Immature Granulocytes 0 Not Estab. %   Immature Grans (Abs) 0.0 0.0 -  0.1 x10E3/uL  CMP14+EGFR   Collection Time: 06/28/23  2:18 PM  Result Value Ref Range   Glucose 78 70 - 99 mg/dL   BUN 15 8 - 27 mg/dL   Creatinine, Ser 1.30 0.76 - 1.27 mg/dL   eGFR 73 >86 VH/QIO/9.62   BUN/Creatinine Ratio 13 10 - 24   Sodium 140 134 - 144 mmol/L   Potassium 4.2 3.5 - 5.2 mmol/L   Chloride 104 96 - 106 mmol/L   CO2 22 20 - 29 mmol/L   Calcium  9.4 8.6 - 10.2 mg/dL   Total Protein 6.9 6.0 - 8.5 g/dL   Albumin 4.6 3.9 - 4.9 g/dL   Globulin, Total 2.3 1.5 - 4.5 g/dL   Bilirubin Total 0.5 0.0 - 1.2 mg/dL   Alkaline Phosphatase 75 44 - 121 IU/L   AST 29 0 - 40 IU/L   ALT 33 0 - 44 IU/L  Lipid panel   Collection Time: 06/28/23  2:18 PM  Result Value Ref Range   Cholesterol, Total 145 100 - 199 mg/dL   Triglycerides 952 0 - 149 mg/dL   HDL 43 >84 mg/dL   VLDL Cholesterol Cal 24 5 - 40 mg/dL   LDL Chol Calc (NIH) 78 0 - 99 mg/dL   Chol/HDL Ratio 3.4 0.0 - 5.0 ratio  PSA, total and free   Collection Time: 06/28/23  2:18 PM  Result Value Ref Range   Prostate Specific Ag, Serum 1.6 0.0 - 4.0 ng/mL   PSA, Free 0.31 N/A ng/mL   PSA, Free Pct 19.4 %       01/16/2024    8:12 AM 06/28/2023    1:45 PM 06/21/2022    9:16 AM 05/09/2022    3:24 PM 06/15/2021    9:32 AM  Depression screen PHQ 2/9  Decreased Interest 0 0 0 0 0  Down, Depressed, Hopeless 0 0 0 0 0  PHQ - 2 Score 0 0 0 0 0  Altered sleeping 1 0 0 1 1  Tired, decreased energy 0 0 0 0 1  Change in appetite 0 0 0 0 0  Feeling bad or failure about yourself  0 0 0 0 0  Trouble concentrating 0 0 0 0 0  Moving slowly or fidgety/restless 0 0 0 0 0  Suicidal thoughts 0 0 0 0 0  PHQ-9 Score 1 0 0 1 2  Difficult doing work/chores Not difficult at all Not difficult at all Not difficult at all Not difficult at all        01/16/2024    8:13 AM  06/28/2023    1:45 PM 06/21/2022    9:16 AM 05/09/2022    3:24 PM  GAD 7 : Generalized Anxiety Score  Nervous, Anxious, on Edge 0 0 0 0  Control/stop worrying 0 0 0 0  Worry too much - different things 0 0 0 0  Trouble relaxing 1 0 0 0  Restless 0 0 0 0  Easily annoyed or irritable 0 0 0 0  Afraid - awful might happen 0 0 0 0  Total GAD 7 Score 1 0 0 0  Anxiety Difficulty Not difficult at all Not difficult at all Not difficult at all Not difficult at all    Pertinent labs & imaging results that were available during my care of the patient were reviewed by me and considered in my medical decision making.  Assessment & Plan:  Dylan Miller was seen today for blisters/hives bilateral feet.  Diagnoses and all  orders for this visit:  1. Skin lesion (Primary) Provide steroid injection as below.  Education provided on dermatitis. Discussed mixing steroid cream and thick tub lotion (Equate Eczema, Cetaphil, Cerave, Aquaphor, or Eucerin) and then applying to affected areas. Will start abx as below given severity and concern for bullous impetigo. Continue OTC zyrtec . Refill of pepcid  to continue.  - triamcinolone  (KENALOG ) 0.025 % ointment; Apply 1 Application topically 2 (two) times daily.  Dispense: 30 g; Refill: 0 - sulfamethoxazole -trimethoprim  (BACTRIM  DS) 800-160 MG tablet; Take 1 tablet by mouth 2 (two) times daily.  Dispense: 14 tablet; Refill: 0 - famotidine  (PEPCID ) 20 MG tablet; Take 1 tablet (20 mg total) by mouth 2 (two) times daily.  Dispense: 60 tablet; Refill: 0 - methylPREDNISolone  acetate (DEPO-MEDROL ) injection 60 mg  2. Allergic contact dermatitis due to other agents As above.  - triamcinolone  (KENALOG ) 0.025 % ointment; Apply 1 Application topically 2 (two) times daily.  Dispense: 30 g; Refill: 0 - sulfamethoxazole -trimethoprim  (BACTRIM  DS) 800-160 MG tablet; Take 1 tablet by mouth 2 (two) times daily.  Dispense: 14 tablet; Refill: 0 - famotidine  (PEPCID ) 20 MG tablet; Take 1  tablet (20 mg total) by mouth 2 (two) times daily.  Dispense: 60 tablet; Refill: 0 - methylPREDNISolone  acetate (DEPO-MEDROL ) injection 60 mg  Continue all other maintenance medications.  Follow up plan: Return if symptoms worsen or fail to improve.  Continue healthy lifestyle choices, including diet (rich in fruits, vegetables, and lean proteins, and low in salt and simple carbohydrates) and exercise (at least 30 minutes of moderate physical activity daily).  Written and verbal instructions provided   The above assessment and management plan was discussed with the patient. The patient verbalized understanding of and has agreed to the management plan. Patient is aware to call the clinic if they develop any new symptoms or if symptoms persist or worsen. Patient is aware when to return to the clinic for a follow-up visit. Patient educated on when it is appropriate to go to the emergency department.   Jacqualyn Mates, DNP-FNP [facility] [address] [place], [place] [address] [phone]

## 2024-01-16 NOTE — Telephone Encounter (Signed)
 Copied from CRM [id number]. Topic: General - Other >> Jan 16, 2024 11:42 AM Retta Caster wrote: Reason for CRM: Curad petrolatum dressing-Patient can not find it in pharmacy needs call back on recommendations where to get it   [phone]

## 2024-01-16 NOTE — Addendum Note (Signed)
 Addended by: Lucinda Saber D on: 01/16/2024 09:01 AM   Modules accepted: Orders

## 2024-01-16 NOTE — Telephone Encounter (Signed)
 Patient aware and verbalized understanding. Gave recommendations

## 2024-07-01 ENCOUNTER — Encounter: Payer: Self-pay | Admitting: Family Medicine

## 2024-07-01 ENCOUNTER — Ambulatory Visit (INDEPENDENT_AMBULATORY_CARE_PROVIDER_SITE_OTHER): Payer: Self-pay | Admitting: Family Medicine

## 2024-07-01 VITALS — BP 131/82 | HR 60 | Ht 67.0 in | Wt 176.0 lb

## 2024-07-01 DIAGNOSIS — E782 Mixed hyperlipidemia: Secondary | ICD-10-CM

## 2024-07-01 DIAGNOSIS — Z0001 Encounter for general adult medical examination with abnormal findings: Secondary | ICD-10-CM

## 2024-07-01 DIAGNOSIS — Z0000 Encounter for general adult medical examination without abnormal findings: Secondary | ICD-10-CM

## 2024-07-01 DIAGNOSIS — E785 Hyperlipidemia, unspecified: Secondary | ICD-10-CM | POA: Diagnosis not present

## 2024-07-01 MED ORDER — ATORVASTATIN CALCIUM 20 MG PO TABS
20.0000 mg | ORAL_TABLET | Freq: Every day | ORAL | 3 refills | Status: AC
Start: 2024-07-01 — End: ?

## 2024-07-01 NOTE — Progress Notes (Signed)
 BP 131/82   Pulse 60   Ht 5' 7 (1.702 m)   Wt 176 lb (79.8 kg)   SpO2 98%   BMI 27.57 kg/m    Subjective:   Patient ID: Belvie Medicine, male    DOB: 1961/05/02, 63 y.o.   MRN: [id number]  HPI: Dylan Miller is a 63 y.o. male presenting on 07/01/2024 for Medical Management of Chronic Issues (CPE)   Discussed the use of AI scribe software for clinical note transcription with the patient, who gave verbal consent to proceed.  History of Present Illness   Dylan Miller is a 63 year old male who presents for an annual physical exam and recheck of medications.  Dyslipidemia management - Takes atorvastatin  daily for cholesterol management  Gastroesophageal reflux symptoms - Uses acid reflux medication as needed - Recently increased frequency to daily use  Sleep disturbance - Works 12-hour night shifts (10 PM to 10 AM) on a three-two schedule, causing disruption of sleep cycle - Used melatonin 10 mg to aid sleep during night shifts - Discontinued melatonin last week due to excessive daytime sleepiness, even on days off  Genitourinary symptoms - No urinary problems          Relevant past medical, surgical, family and social history reviewed and updated as indicated. Interim medical history since our last visit reviewed. Allergies and medications reviewed and updated.  Review of Systems  Constitutional:  Negative for chills and fever.  HENT:  Negative for ear pain and tinnitus.   Eyes:  Negative for pain and discharge.  Respiratory:  Negative for cough, shortness of breath and wheezing.   Cardiovascular:  Negative for chest pain, palpitations and leg swelling.  Gastrointestinal:  Negative for abdominal pain, blood in stool, constipation and diarrhea.  Genitourinary:  Negative for dysuria and hematuria.  Musculoskeletal:  Negative for back pain, gait problem and myalgias.  Skin:  Negative for rash.  Neurological:  Negative for dizziness, weakness and headaches.   Psychiatric/Behavioral:  Negative for suicidal ideas.   All other systems reviewed and are negative.   Per HPI unless specifically indicated above   Allergies as of 07/01/2024       Reactions   Adhesive [tape] Rash   BAND-AID         Medication List        Accurate as of July 01, 2024  2:57 PM. If you have any questions, ask your nurse or doctor.          STOP taking these medications    sulfamethoxazole -trimethoprim  800-160 MG tablet Commonly known as: BACTRIM  DS Stopped by: [facility] Sadhana Frater   triamcinolone  0.025 % ointment Commonly known as: KENALOG  Stopped by: [facility] Milca Sytsma       TAKE these medications    atorvastatin  20 MG tablet Commonly known as: LIPITOR Take 1 tablet (20 mg total) by mouth at bedtime.   famotidine  20 MG tablet Commonly known as: Pepcid  Take 1 tablet (20 mg total) by mouth 2 (two) times daily.   ibuprofen 200 MG tablet Commonly known as: ADVIL Take 200 mg by mouth daily as needed for moderate pain (pain score 4-6).   naproxen sodium 220 MG tablet Commonly known as: ALEVE Take 220 mg by mouth daily as needed (pain).         Objective:   BP 131/82   Pulse 60   Ht 5' 7 (1.702 m)   Wt 176 lb (79.8 kg)   SpO2 98%   BMI 27.57 kg/m  Wt Readings from Last 3 Encounters:  07/01/24 176 lb (79.8 kg)  01/16/24 169 lb (76.7 kg)  06/28/23 171 lb (77.6 kg)    Physical Exam Vitals and nursing note reviewed.    Physical Exam   VITALS: BP- 131/82 HEENT: External auditory canals clear bilaterally. Pharynx normal, no exudate. NECK: Thyroid normal, no masses. No cervical lymphadenopathy. CHEST: Lungs clear to auscultation bilaterally. CARDIOVASCULAR: Regular rate and rhythm, no murmurs. Peripheral pulses intact. ABDOMEN: Abdomen soft, non-tender. EXTREMITIES: No edema in lower extremities. NEUROLOGICAL: Reflexes normal.         Assessment & Plan:   Problem List Items Addressed This Visit       Other    Hyperlipidemia   Relevant Medications   atorvastatin  (LIPITOR) 20 MG tablet   Other Relevant Orders   CBC With Diff/Platelet   CMP14+EGFR   Lipid panel   PSA, total and free   CBC With Diff/Platelet   CMP14+EGFR   Lipid panel   PSA, total and free   Other Visit Diagnoses       Physical exam    -  Primary   Relevant Orders   CBC With Diff/Platelet   CMP14+EGFR   Lipid panel   PSA, total and free          Adult Wellness Visit Routine adult wellness visit with normal findings. - Ordered blood work for prostate screening.  Mixed hyperlipidemia Managed with atorvastatin .  Gastroesophageal reflux disease Managed with as-needed medication.  Sleep-wake cycle disturbance related to shift work Sleep disturbance due to shift work. Melatonin 10 mg caused excessive sleepiness. Discussed alternatives. - Reduce melatonin dose to 5 mg or use Tylenol PM as needed.          Follow up plan: Return in about 1 year (around 07/01/2025), or if symptoms worsen or fail to improve, for Physical exam.  Counseling provided for all of the vaccine components Orders Placed This Encounter  Procedures   CBC With Diff/Platelet   CMP14+EGFR   Lipid panel   PSA, total and free    Fonda Levins, MD [facility] 07/01/2024, 2:57 PM

## 2024-07-02 LAB — CBC WITH DIFF/PLATELET
Basophils Absolute: 0.1 x10E3/uL (ref 0.0–0.2)
Basos: 1 %
EOS (ABSOLUTE): 0.1 x10E3/uL (ref 0.0–0.4)
Eos: 2 %
Hematocrit: 41.1 % (ref 37.5–51.0)
Hemoglobin: 13.7 g/dL (ref 13.0–17.7)
Immature Grans (Abs): 0 x10E3/uL (ref 0.0–0.1)
Immature Granulocytes: 0 %
Lymphocytes Absolute: 2.6 x10E3/uL (ref 0.7–3.1)
Lymphs: 39 %
MCH: 30.2 pg (ref 26.6–33.0)
MCHC: 33.3 g/dL (ref 31.5–35.7)
MCV: 91 fL (ref 79–97)
Monocytes Absolute: 0.6 x10E3/uL (ref 0.1–0.9)
Monocytes: 9 %
Neutrophils Absolute: 3.2 x10E3/uL (ref 1.4–7.0)
Neutrophils: 49 %
Platelets: 275 x10E3/uL (ref 150–450)
RBC: 4.54 x10E6/uL (ref 4.14–5.80)
RDW: 12.6 % (ref 11.6–15.4)
WBC: 6.6 x10E3/uL (ref 3.4–10.8)

## 2024-07-02 LAB — LIPID PANEL
Chol/HDL Ratio: 3.6 ratio (ref 0.0–5.0)
Cholesterol, Total: 153 mg/dL (ref 100–199)
HDL: 42 mg/dL (ref >39)
LDL Chol Calc (NIH): 82 mg/dL (ref 0–99)
Triglycerides: 169 mg/dL — ABNORMAL HIGH (ref 0–149)
VLDL Cholesterol Cal: 29 mg/dL (ref 5–40)

## 2024-07-02 LAB — CMP14+EGFR
ALT: 29 IU/L (ref 0–44)
AST: 23 IU/L (ref 0–40)
Albumin: 4.7 g/dL (ref 3.9–4.9)
Alkaline Phosphatase: 75 IU/L (ref 47–123)
BUN/Creatinine Ratio: 10 (ref 10–24)
BUN: 13 mg/dL (ref 8–27)
Bilirubin Total: 0.6 mg/dL (ref 0.0–1.2)
CO2: 22 mmol/L (ref 20–29)
Calcium: 9.8 mg/dL (ref 8.6–10.2)
Chloride: 99 mmol/L (ref 96–106)
Creatinine, Ser: 1.24 mg/dL (ref 0.76–1.27)
Globulin, Total: 2.3 g/dL (ref 1.5–4.5)
Glucose: 90 mg/dL (ref 70–99)
Potassium: 4.8 mmol/L (ref 3.5–5.2)
Sodium: 141 mmol/L (ref 134–144)
Total Protein: 7 g/dL (ref 6.0–8.5)
eGFR: 65 mL/min/1.73 (ref >59)

## 2024-07-02 LAB — PSA, TOTAL AND FREE
PSA, Free Pct: 18.8 %
PSA, Free: 0.3 ng/mL
Prostate Specific Ag, Serum: 1.6 ng/mL (ref 0.0–4.0)

## 2024-07-08 ENCOUNTER — Ambulatory Visit: Payer: Self-pay | Admitting: Family Medicine
